# Patient Record
Sex: Male | Born: 1980 | Race: White | Hispanic: No | Marital: Married | Smoking: Never smoker
Health system: Southern US, Community
[De-identification: ages and names within clinical notes are randomized; demographics above are authoritative.]

## PROBLEM LIST (undated history)

## (undated) DIAGNOSIS — G4700 Insomnia, unspecified: Secondary | ICD-10-CM

## (undated) DIAGNOSIS — G4730 Sleep apnea, unspecified: Secondary | ICD-10-CM

## (undated) DIAGNOSIS — Z8249 Family history of ischemic heart disease and other diseases of the circulatory system: Secondary | ICD-10-CM

## (undated) DIAGNOSIS — I10 Essential (primary) hypertension: Secondary | ICD-10-CM

## (undated) HISTORY — DX: Sleep apnea, unspecified: G47.30

## (undated) HISTORY — DX: Family history of ischemic heart disease and other diseases of the circulatory system: Z82.49

## (undated) HISTORY — DX: Essential (primary) hypertension: I10

## (undated) HISTORY — DX: Insomnia, unspecified: G47.00

---

## 2015-03-25 DIAGNOSIS — G4733 Obstructive sleep apnea (adult) (pediatric): Secondary | ICD-10-CM | POA: Insufficient documentation

## 2015-03-25 DIAGNOSIS — I10 Essential (primary) hypertension: Secondary | ICD-10-CM | POA: Insufficient documentation

## 2015-03-27 DIAGNOSIS — M519 Unspecified thoracic, thoracolumbar and lumbosacral intervertebral disc disorder: Secondary | ICD-10-CM | POA: Insufficient documentation

## 2015-03-27 DIAGNOSIS — M25571 Pain in right ankle and joints of right foot: Secondary | ICD-10-CM | POA: Insufficient documentation

## 2015-08-27 ENCOUNTER — Ambulatory Visit (INDEPENDENT_AMBULATORY_CARE_PROVIDER_SITE_OTHER): Payer: 59 | Admitting: Osteopathic Medicine

## 2015-08-27 ENCOUNTER — Other Ambulatory Visit: Payer: Self-pay | Admitting: Osteopathic Medicine

## 2015-08-27 ENCOUNTER — Encounter: Payer: Self-pay | Admitting: Osteopathic Medicine

## 2015-08-27 VITALS — BP 125/85 | HR 78 | Ht 74.0 in | Wt 304.0 lb

## 2015-08-27 DIAGNOSIS — G4700 Insomnia, unspecified: Secondary | ICD-10-CM

## 2015-08-27 DIAGNOSIS — Z8249 Family history of ischemic heart disease and other diseases of the circulatory system: Secondary | ICD-10-CM | POA: Diagnosis not present

## 2015-08-27 DIAGNOSIS — R202 Paresthesia of skin: Secondary | ICD-10-CM

## 2015-08-27 HISTORY — DX: Family history of ischemic heart disease and other diseases of the circulatory system: Z82.49

## 2015-08-27 HISTORY — DX: Insomnia, unspecified: G47.00

## 2015-08-27 LAB — GLUCOSE, POCT (MANUAL RESULT ENTRY): POC Glucose: 92 mg/dl (ref 70–99)

## 2015-08-27 MED ORDER — GABAPENTIN 100 MG PO CAPS: 100.0000 mg | ORAL_CAPSULE | Freq: Three times a day (TID) | ORAL | Status: DC | PRN

## 2015-08-27 MED ORDER — TRAZODONE HCL 100 MG PO TABS: 50.0000 mg | ORAL_TABLET | Freq: Every evening | ORAL | Status: DC | PRN

## 2015-08-27 NOTE — Progress Notes (Signed)
/PI: Wesley Pacheco is a 35 y.o. Not Hispanic or Latino male   who presents to [facility] today, 08/27/2015,  for chief complaint of:  Chief Complaint  Patient presents with  . Establish Care    BLOOD PRESSURE, FOOT PAIN B/L    Foot pain . Context: hx back injury years ago. Had MRI 10/2013 in [place] - (+) pinched nerve, bulging discs, crushed vertebrae, DJD advanced. 05/2014 injury and whole left leg was numb. Patient is concerned that numbness may have something to do with diabetes. He has never had routine blood work that he can recall. . Location: both feet  . Quality: "feels like nerve pains" occasional numbness, one leg or another . Timing: "out of the blue," seems worse "if I eat really bad that day." particularly when eats red meat, no correlation with sugary or salty foods.  . Assoc signs/symptoms: occasional swelling in feet, whichever is painful.    MEDICAL HISTORY BRIEFLY REVIEWED   SLEEP APNEA - CPAP machine, uses about 50% of the time, has been a few years since updated   HIGH BLOOD PRESSURE - Usually 160's / 90's. Per the patient. On my record review, seems to be 0000000 systolic. Father deceased in 23's due to MI - HTN, no EtOH or Cocaine. Had aortic dissection.   INSOMNIA - Melatonin helps.     Patient is accompanied by wife, Museum/gallery conservator, who assists with history-taking.     Past medical, surgical, social and family history reviewed: No past medical history on file. No past surgical history on file. Social History  Substance Use Topics  . Smoking status: Not on file  . Smokeless tobacco: Not on file  . Alcohol Use: Not on file   No family history on file.   Current medication list and allergy/intolerance information reviewed:   Current Outpatient Prescriptions  Medication Sig Dispense Refill  . fluticasone (FLONASE) 50 MCG/ACT nasal spray      No current facility-administered medications for this visit.   No Known Allergies     Review of Systems:  Constitutional:  No  fever, no chills, No recent illness, No unintentional weight changes. No significant fatigue.   HEENT: (+) occasional headache, no vision change, no hearing change, No sore throat, No  sinus pressure  Cardiac: No  chest pain, No  pressure, No palpitations, No  Orthopnea   Respiratory:  No  shortness of breath. No  Cough  Gastrointestinal: No  abdominal pain, No  nausea, No  vomiting,  No  blood in stool, No  diarrhea, No  constipation   Musculoskeletal: No new myalgia/arthralgia, (+) chronic back pain   Genitourinary: No  incontinence, No  abnormal genital bleeding, No abnormal genital discharge  Skin: No  Rash, No other wounds/concerning lesions  Hem/Onc: No  easy bruising/bleeding, No  abnormal lymph node  Endocrine: No cold intolerance,  No heat intolerance. No polyuria/polydipsia/polyphagia   Neurologic: No  weakness, No  dizziness, No  slurred speech/focal weakness/facial droop, (+) burning pain in feet  Psychiatric: No  concerns with depression, No  concerns with anxiety, (+) sleep problems, No mood problems  Exam:  BP 159/78 mmHg  Pulse 78  Ht 6\' 2"  (1.88 m)  Wt 304 lb (137.893 kg)  BMI 39.01 kg/m2  Constitutional: VS see above. General Appearance: alert, well-developed, well-nourished, NAD  Eyes: Normal lids and conjunctive, non-icteric sclera  Ears, Nose, Mouth, Throat: MMM, Normal external inspection ears/nares/mouth/lips/gums\ Pharynx/tonsils no erythema, no exudate. Nasal mucosa normal.  Neck: No masses, trachea midline. No thyroid enlargement. No tenderness/mass appreciated. No lymphadenopathy  Respiratory: Normal respiratory effort. no wheeze, no rhonchi, no rales  Cardiovascular: S1/S2 normal, no murmur, no rub/gallop auscultated. RRR. No lower extremity edema. Pedal pulse II/IV bilaterally DP and PT. No abdominal aortic bruit. Capillary refill slightly over 3 seconds in feet, normal elsewhere. Homans sign  negative bilaterally  Gastrointestinal: Nontender, no masses. No hepatomegaly, no splenomegaly. No hernia appreciated. Bowel sounds normal. Rectal exam deferred.   Musculoskeletal: Gait normal. No clubbing/cyanosis of digits. MR intermission ankles bilaterally, drawer test negative. Straight leg raise negative bilaterally.  Neurological: No cranial nerve deficit on limited exam. Normal balance/coordination. No tremor. No significant diminished sensation on monofilament exam, some decreased sensation calluses over heels.  Skin: warm, dry, intact. No rash/ulcer. No concerning nevi or subq nodules on limited exam.    Psychiatric: Normal judgment/insight. Normal mood and affect. Oriented x3.    Results for orders placed or performed in visit on 08/27/15 (from the past 72 hour(s))  POCT glucose (manual entry)     Status: None   Collection Time: 08/27/15 10:15 AM  Result Value Ref Range   POC Glucose 92 70 - 99 mg/dl     ASSESSMENT/PLAN:   Neuropathic foot pain/paresthesia more likely due to history of back issues, we'll see if we can request the previous MRI results, sounds like he definitely had some issues as far as degenerative disc disease, possible foraminal stenosis, consider repeat MRI. Trial gabapentin when burning pain is a problem. Unlikely diabetes based on nonfasting blood glucose in the office today  Advised to follow-up to recheck CPAP settings, this may be contributing to insomnia. Trial trazodone as needed.  Significant cardiac risk based on family history, lipid screening as below, increase aerobic exercise, advised weight loss, dietary modifications to lose weight and overall decrease cardiovascular risk  Paresthesia of bilateral legs - Plan: POCT glucose (manual entry), CBC with Differential/Platelet, COMPLETE METABOLIC PANEL WITH GFR, Lipid panel, TSH, VITAMIN D 25 Hydroxy (Vit-D Deficiency, Fractures), B12, DISCONTINUED: gabapentin (NEURONTIN) 100 MG capsule  Family  history of cardiac disorder in father - Plan: COMPLETE METABOLIC PANEL WITH GFR, Lipid panel, TSH  Insomnia - Plan: DISCONTINUED: traZODone (DESYREL) 100 MG tablet     Visit summary with medication list and pertinent instructions was printed for patient to review. All questions at time of visit were answered - patient instructed to contact office with any additional concerns. ER/RTC precautions were reviewed with the patient. Follow-up plan: Return if symptoms worsen or fail to improve - can discuss alternative medicines/doses or further workup. We'll request records regarding old MRI

## 2015-08-27 NOTE — Patient Instructions (Signed)
I'd recommend follow up with your home CPAP company to inquire about re-testing for CPAP adjustment and sleep apnea severity as this can affect blood pressure and insomnia issues.   Let's plan to follow up in 3 months with nurse visit for blood pressure check, sooner if needed and/or depending on lab results.

## 2016-11-26 ENCOUNTER — Other Ambulatory Visit: Payer: Self-pay | Admitting: Osteopathic Medicine

## 2016-11-26 ENCOUNTER — Encounter: Payer: Self-pay | Admitting: Osteopathic Medicine

## 2016-11-26 ENCOUNTER — Ambulatory Visit (INDEPENDENT_AMBULATORY_CARE_PROVIDER_SITE_OTHER): Payer: 59 | Admitting: Osteopathic Medicine

## 2016-11-26 VITALS — BP 150/90 | Ht 74.0 in | Wt 311.0 lb

## 2016-11-26 DIAGNOSIS — D229 Melanocytic nevi, unspecified: Secondary | ICD-10-CM

## 2016-11-26 DIAGNOSIS — I10 Essential (primary) hypertension: Secondary | ICD-10-CM

## 2016-11-26 DIAGNOSIS — G4730 Sleep apnea, unspecified: Secondary | ICD-10-CM

## 2016-11-26 DIAGNOSIS — G4733 Obstructive sleep apnea (adult) (pediatric): Secondary | ICD-10-CM

## 2016-11-26 HISTORY — DX: Sleep apnea, unspecified: G47.30

## 2016-11-26 MED ORDER — AMBULATORY NON FORMULARY MEDICATION: 99 refills | Status: DC

## 2016-11-26 MED ORDER — ENALAPRIL MALEATE 5 MG PO TABS: 5.0000 mg | ORAL_TABLET | Freq: Every day | ORAL | 1 refills | Status: DC

## 2016-11-26 NOTE — Patient Instructions (Addendum)
Start blood pressure medication/ Will get labs today. Will check BP again in the office in 2-4 weeks. Will recheck labs in 4-6 weeks.   You should hear bck about CPAP equipment   Even fairly severe allergies can typically be treated with over-the-counter medications, I usually will recommend a combination of steroid nasal spray such as Flonase or Nasonex or either one of their generics, in combination with an antihistamine such as Allegra, Zyrtec, or Claritin or one of the generics, plus or minus a decongestant, either Sudafed on its own or any of the antihistamines with the "-D" in the name that he has to show your ID to the pharmacist to buy.      DASH Eating Plan DASH stands for "Dietary Approaches to Stop Hypertension." The DASH eating plan is a healthy eating plan that has been shown to reduce high blood pressure (hypertension). It may also reduce your risk for type 2 diabetes, heart disease, and stroke. The DASH eating plan may also help with weight loss. What are tips for following this plan? General guidelines  Avoid eating more than 2,300 mg (milligrams) of salt (sodium) a day. If you have hypertension, you may need to reduce your sodium intake to 1,500 mg a day.  Limit alcohol intake to no more than 1 drink a day for nonpregnant women and 2 drinks a day for men. One drink equals 12 oz of beer, 5 oz of wine, or 1 oz of hard liquor.  Work with your health care provider to maintain a healthy body weight or to lose weight. Ask what an ideal weight is for you.  Get at least 30 minutes of exercise that causes your heart to beat faster (aerobic exercise) most days of the week. Activities may include walking, swimming, or biking.  Work with your health care provider or diet and nutrition specialist (dietitian) to adjust your eating plan to your individual calorie needs. Reading food labels  Check food labels for the amount of sodium per serving. Choose foods with less than 5 percent of  the Daily Value of sodium. Generally, foods with less than 300 mg of sodium per serving fit into this eating plan.  To find whole grains, look for the word "whole" as the first word in the ingredient list. Shopping  Buy products labeled as "low-sodium" or "no salt added."  Buy fresh foods. Avoid canned foods and premade or frozen meals. Cooking  Avoid adding salt when cooking. Use salt-free seasonings or herbs instead of table salt or sea salt. Check with your health care provider or pharmacist before using salt substitutes.  Do not fry foods. Cook foods using healthy methods such as baking, boiling, grilling, and broiling instead.  Cook with heart-healthy oils, such as olive, canola, soybean, or sunflower oil. Meal planning   Eat a balanced diet that includes: ? 5 or more servings of fruits and vegetables each day. At each meal, try to fill half of your plate with fruits and vegetables. ? Up to 6-8 servings of whole grains each day. ? Less than 6 oz of lean meat, poultry, or fish each day. A 3-oz serving of meat is about the same size as a deck of cards. One egg equals 1 oz. ? 2 servings of low-fat dairy each day. ? A serving of nuts, seeds, or beans 5 times each week. ? Heart-healthy fats. Healthy fats called Omega-3 fatty acids are found in foods such as flaxseeds and coldwater fish, like sardines, salmon, and mackerel.  how much you eat of the following: ? Canned or prepackaged foods. ? Food that is high in trans fat, such as fried foods. ? Food that is high in saturated fat, such as fatty meat. ? Sweets, desserts, sugary drinks, and other foods with added sugar. ? Full-fat dairy products.  Do not salt foods before eating.  Try to eat at least 2 vegetarian meals each week.  Eat more home-cooked food and less restaurant, buffet, and fast food.  When eating at a restaurant, ask that your food be prepared with less salt or no salt, if possible. What foods are  recommended? The items listed may not be a complete list. Talk with your dietitian about what dietary choices are best for you. Grains Whole-grain or whole-wheat bread. Whole-grain or whole-wheat pasta. Brown rice. Oatmeal. Quinoa. Bulgur. Whole-grain and low-sodium cereals. Pita bread. Low-fat, low-sodium crackers. Whole-wheat flour tortillas. Vegetables Fresh or frozen vegetables (raw, steamed, roasted, or grilled). Low-sodium or reduced-sodium tomato and vegetable juice. Low-sodium or reduced-sodium tomato sauce and tomato paste. Low-sodium or reduced-sodium canned vegetables. Fruits All fresh, dried, or frozen fruit. Canned fruit in natural juice (without added sugar). Meat and other protein foods Skinless chicken or turkey. Ground chicken or turkey. Pork with fat trimmed off. Fish and seafood. Egg whites. Dried beans, peas, or lentils. Unsalted nuts, nut butters, and seeds. Unsalted canned beans. Lean cuts of beef with fat trimmed off. Low-sodium, lean deli meat. Dairy Low-fat (1%) or fat-free (skim) milk. Fat-free, low-fat, or reduced-fat cheeses. Nonfat, low-sodium ricotta or cottage cheese. Low-fat or nonfat yogurt. Low-fat, low-sodium cheese. Fats and oils Soft margarine without trans fats. Vegetable oil. Low-fat, reduced-fat, or light mayonnaise and salad dressings (reduced-sodium). Canola, safflower, olive, soybean, and sunflower oils. Avocado. Seasoning and other foods Herbs. Spices. Seasoning mixes without salt. Unsalted popcorn and pretzels. Fat-free sweets. What foods are not recommended? The items listed may not be a complete list. Talk with your dietitian about what dietary choices are best for you. Grains Baked goods made with fat, such as croissants, muffins, or some breads. Dry pasta or rice meal packs. Vegetables Creamed or fried vegetables. Vegetables in a cheese sauce. Regular canned vegetables (not low-sodium or reduced-sodium). Regular canned tomato sauce and paste (not  low-sodium or reduced-sodium). Regular tomato and vegetable juice (not low-sodium or reduced-sodium). Pickles. Olives. Fruits Canned fruit in a light or heavy syrup. Fried fruit. Fruit in cream or butter sauce. Meat and other protein foods Fatty cuts of meat. Ribs. Fried meat. Bacon. Sausage. Bologna and other processed lunch meats. Salami. Fatback. Hotdogs. Bratwurst. Salted nuts and seeds. Canned beans with added salt. Canned or smoked fish. Whole eggs or egg yolks. Chicken or turkey with skin. Dairy Whole or 2% milk, cream, and half-and-half. Whole or full-fat cream cheese. Whole-fat or sweetened yogurt. Full-fat cheese. Nondairy creamers. Whipped toppings. Processed cheese and cheese spreads. Fats and oils Butter. Stick margarine. Lard. Shortening. Ghee. Bacon fat. Tropical oils, such as coconut, palm kernel, or palm oil. Seasoning and other foods Salted popcorn and pretzels. Onion salt, garlic salt, seasoned salt, table salt, and sea salt. Worcestershire sauce. Tartar sauce. Barbecue sauce. Teriyaki sauce. Soy sauce, including reduced-sodium. Steak sauce. Canned and packaged gravies. Fish sauce. Oyster sauce. Cocktail sauce. Horseradish that you find on the shelf. Ketchup. Mustard. Meat flavorings and tenderizers. Bouillon cubes. Hot sauce and Tabasco sauce. Premade or packaged marinades. Premade or packaged taco seasonings. Relishes. Regular salad dressings. Where to find more information:  National Heart, Lung, and Blood Institute:   Institute: https://wilson-eaton.com/  American Heart Association: www.heart.org Summary  The DASH eating plan is a healthy eating plan that has been shown to reduce high blood pressure (hypertension). It may also reduce your risk for type 2 diabetes, heart disease, and stroke.  With the DASH eating plan, you should limit salt (sodium) intake to 2,300 mg a day. If you have hypertension, you may need to reduce your sodium intake to 1,500 mg a day.  When on the DASH eating plan,  aim to eat more fresh fruits and vegetables, whole grains, lean proteins, low-fat dairy, and heart-healthy fats.  Work with your health care provider or diet and nutrition specialist (dietitian) to adjust your eating plan to your individual calorie needs. This information is not intended to replace advice given to you by your health care provider. Make sure you discuss any questions you have with your health care provider. Document Released: 01/22/2011 Document Revised: 01/27/2016 Document Reviewed: 01/27/2016 Elsevier Interactive Patient Education  2017 [place].     Heart-Healthy Eating Plan Many factors influence your heart health, including eating and exercise habits. Heart (coronary) risk increases with abnormal blood fat (lipid) levels. Heart-healthy meal planning includes limiting unhealthy fats, increasing healthy fats, and making other small dietary changes. This includes maintaining a healthy body weight to help keep lipid levels within a normal range.  What types of fat should I choose?  Choose healthy fats more often. Choose monounsaturated and polyunsaturated fats, such as olive oil and canola oil, flaxseeds, walnuts, almonds, and seeds.  Eat more omega-3 fats. Good choices include salmon, mackerel, sardines, tuna, flaxseed oil, and ground flaxseeds. Aim to eat fish at least two times each week.  Limit saturated fats. Saturated fats are primarily found in animal products, such as meats, butter, and cream. Plant sources of saturated fats include palm oil, palm kernel oil, and coconut oil.  Avoid foods with partially hydrogenated oils in them. These contain trans fats. Examples of foods that contain trans fats are stick margarine, some tub margarines, cookies, crackers, and other baked goods. What general guidelines do I need to follow?  Check food labels carefully to identify foods with trans fats or high amounts of saturated fat.  Fill one half of your plate with  vegetables and green salads. Eat 4-5 servings of vegetables per day. A serving of vegetables equals 1 cup of raw leafy vegetables,  cup of raw or cooked cut-up vegetables, or  cup of vegetable juice.  Fill one fourth of your plate with whole grains. Look for the word "whole" as the first word in the ingredient list.  Fill one fourth of your plate with lean protein foods.  Eat 4-5 servings of fruit per day. A serving of fruit equals one medium whole fruit,  cup of dried fruit,  cup of fresh, frozen, or canned fruit, or  cup of 100% fruit juice.  Eat more foods that contain soluble fiber. Examples of foods that contain this type of fiber are apples, broccoli, carrots, beans, peas, and barley. Aim to get 20-30 g of fiber per day.  Eat more home-cooked food and less restaurant, buffet, and fast food.  Limit or avoid alcohol.  Limit foods that are high in starch and sugar.  Avoid fried foods.  Cook foods by using methods other than frying. Baking, boiling, grilling, and broiling are all great options. Other fat-reducing suggestions include: ? Removing the skin from poultry. ? Removing all visible fats from meats. ? Skimming the fat off of stews,  soups, and gravies before serving them. ? Steaming vegetables in water or broth.  Lose weight if you are overweight. Losing just 5-10% of your initial body weight can help your overall health and prevent diseases such as diabetes and heart disease.  Increase your consumption of nuts, legumes, and seeds to 4-5 servings per week. One serving of dried beans or legumes equals  cup after being cooked, one serving of nuts equals 1 ounces, and one serving of seeds equals  ounce or 1 tablespoon.  You may need to monitor your salt (sodium) intake, especially if you have high blood pressure. Talk with your health care provider or dietitian to get more information about reducing sodium. What foods can I eat? Grains  Breads, including [place], white,  pita, wheat, raisin, rye, oatmeal, and [place]. Tortillas that are neither fried nor made with lard or trans fat. Low-fat rolls, including hotdog and hamburger buns and English muffins. Biscuits. Muffins. Waffles. Pancakes. Light popcorn. Whole-grain cereals. Flatbread. Melba toast. Pretzels. Breadsticks. Rusks. Low-fat snacks and crackers, including oyster, saltine, matzo, graham, animal, and rye. Rice and pasta, including brown rice and those that are made with whole wheat. Vegetables All vegetables. Fruits All fruits, but limit coconut. Meats and Other Protein Sources Lean, well-trimmed beef, veal, pork, and lamb. Chicken and [place] without skin. All fish and shellfish. Wild duck, rabbit, pheasant, and venison. Egg whites or low-cholesterol egg substitutes. Dried beans, peas, lentils, and tofu.Seeds and most nuts. Dairy Low-fat or nonfat cheeses, including ricotta, string, and mozzarella. Skim or 1% milk that is liquid, powdered, or evaporated. Buttermilk that is made with low-fat milk. Nonfat or low-fat yogurt. Beverages Mineral water. Diet carbonated beverages. Sweets and Desserts Sherbets and fruit ices. Honey, jam, marmalade, jelly, and syrups. Meringues and gelatins. Pure sugar candy, such as hard candy, jelly beans, gumdrops, mints, marshmallows, and small amounts of dark chocolate. [employer] Eat all sweets and desserts in moderation. Fats and Oils Nonhydrogenated (trans-free) margarines. Vegetable oils, including soybean, sesame, sunflower, olive, peanut, safflower, corn, canola, and cottonseed. Salad dressings or mayonnaise that are made with a vegetable oil. Limit added fats and oils that you use for cooking, baking, salads, and as spreads. Other Cocoa powder. Coffee and tea. All seasonings and condiments. The items listed above may not be a complete list of recommended foods or beverages. Contact your dietitian for more options. What foods are not recommended? Grains Breads  that are made with saturated or trans fats, oils, or whole milk. Croissants. Butter rolls. Cheese breads. Sweet rolls. Donuts. Buttered popcorn. Chow mein noodles. High-fat crackers, such as cheese or butter crackers. Meats and Other Protein Sources Fatty meats, such as hotdogs, short ribs, sausage, spareribs, bacon, ribeye roast or steak, and mutton. High-fat deli meats, such as salami and bologna. Caviar. Domestic duck and goose. Organ meats, such as kidney, liver, sweetbreads, brains, gizzard, chitterlings, and heart. Dairy Cream, sour cream, cream cheese, and creamed cottage cheese. Whole milk cheeses, including blue (bleu), Monte[place] Jack, [place], [place], [place], [place], [place], [place], [place], and [place]. Whole or 2% milk that is liquid, evaporated, or condensed. Whole buttermilk. Cream sauce or high-fat cheese sauce. Yogurt that is made from whole milk. Beverages Regular sodas and drinks with added sugar. Sweets and Desserts Frosting. Pudding. Cookies. Cakes other than angel food cake. Candy that has milk chocolate or white chocolate, hydrogenated fat, butter, coconut, or unknown ingredients. Buttered syrups. Full-fat ice cream or ice cream drinks. Fats and Oils Gravy that has suet, meat fat,  or shortening. Cocoa butter, hydrogenated oils, palm oil, coconut oil, palm kernel oil. These can often be found in baked products, candy, fried foods, nondairy creamers, and whipped toppings. Solid fats and shortenings, including bacon fat, salt pork, lard, and butter. Nondairy cream substitutes, such as coffee creamers and sour cream substitutes. Salad dressings that are made of unknown oils, cheese, or sour cream. The items listed above may not be a complete list of foods and beverages to avoid. Contact your dietitian for more information. This information is not intended to replace advice given to you by your health care provider. Make sure you discuss any questions you have with your health care  provider. Document Released: 11/12/2007 Document Revised: 08/23/2015 Document Reviewed: 07/27/2013 Elsevier Interactive Patient Education  2017 [employer].

## 2016-11-26 NOTE — Progress Notes (Signed)
HPI: Wesley Pacheco is a 36 y.o. male  who presents to [facility] today, 11/26/16,  for chief complaint of:  Chief Complaint  Patient presents with  . Rash    ON HEAD  . Other    BLOOD PRESSURE CONCERNS    Blood pressure: Positive family history of heart problems and blood pressure issues in dad. Patient concerned about symptoms of headache with elevated blood pressure. No chest pain, dizziness, vision changes. Other risk factors include history of sleep apnea, has been years since sleep study or new equipment. He does not complain of any worsening daytime somnolence or snoring. He does have significant facial hair  Would like spot of skin on head checked. Has been there for some time, not painful/itchy, not bothersome. Doesn't seem to be growing.   Past medical, surgical, social and family history reviewed: Patient Active Problem List   Diagnosis Date Noted  . Paresthesia of bilateral legs 08/27/2015  . Family history of cardiac disorder in father 08/27/2015  . Insomnia 08/27/2015   No past surgical history on file. Social History  Substance Use Topics  . Smoking status: Never Smoker  . Smokeless tobacco: Never Used  . Alcohol use Not on file   Family History  Problem Relation Age of Onset  . Cancer Mother   . Hypertension Father   . Heart attack Father      Current medication list and allergy/intolerance information reviewed:   Current Outpatient Prescriptions  Medication Sig Dispense Refill  . ibuprofen (ADVIL,MOTRIN) 600 MG tablet Take 600 mg by mouth every 6 (six) hours as needed.    . Melatonin 1 MG CAPS Take by mouth at bedtime.      No current facility-administered medications for this visit.    No Known Allergies    Review of Systems:  Constitutional:  No  fever, no chills, No recent illness  HEENT: No  headache, no vision change  Cardiac: No  chest pain, No  pressure, No palpitations, No  Orthopnea  Respiratory:  No   shortness of breath. No  Cough  Gastrointestinal: No  abdominal pain, No  nausea  Musculoskeletal: No new myalgia/arthralgia  Skin: No  Rash, +other wounds/concerning lesions  Neurologic: No  weakness, No  dizziness   Exam:  BP (!) 150/90   Ht 6\' 2"  (1.88 m)   Wt (!) 311 lb (141.1 kg)   BMI 39.93 kg/m   Constitutional: VS see above. General Appearance: alert, well-developed, well-nourished, NAD  Eyes: Normal lids and conjunctive, non-icteric sclera  Ears, Nose, Mouth, Throat: MMM, Normal external inspection ears/nares/mouth/lips/gums.  Neck: No masses, trachea midline. No thyroid enlargement. No tenderness/mass appreciated. No lymphadenopathy  Respiratory: Normal respiratory effort. no wheeze, no rhonchi, no rales  Cardiovascular: S1/S2 normal, no murmur, no rub/gallop auscultated. RRR. No lower extremity edema.   Musculoskeletal: Gait normal. No clubbing/cyanosis of digits.   Neurological: Normal balance/coordination. No tremor  Skin: warm, dry, intact. No rash/ulcer. No concerning nevi or subq nodules on limited exam - benign slighly hyperpigmented elsions on forehead/scalp   Psychiatric: Normal judgment/insight. Normal mood and affect. Oriented x3.     ASSESSMENT/PLAN:   Essential hypertension - Plan: CBC, COMPLETE METABOLIC PANEL WITH GFR, Lipid panel, TSH  Obstructive sleep apnea syndrome - Plan: TSH, Ambulatory referral to Sleep Studies  Benign nevus of skin - Keep an eye on this, if changes/worsens would consider biopsy/dermatology referral    Patient Instructions  Start blood pressure medication/ Will get labs today.  Will check BP again in the office in 2-4 weeks. Will recheck labs in 4-6 weeks.   You should hear back about CPAP equipment   Even fairly severe allergies can typically be treated with over-the-counter medications, I usually will recommend a combination of steroid nasal spray such as Flonase or Nasonex or either one of their generics, in  combination with an antihistamine such as Allegra, Zyrtec, or Claritin or one of the generics, plus or minus a decongestant, either Sudafed on its own or any of the antihistamines with the "-D" in the name that he has to show your ID to the pharmacist to buy.       Visit summary with medication list and pertinent instructions was printed for patient to review. All questions at time of visit were answered - patient instructed to contact office with any additional concerns. ER/RTC precautions were reviewed with the patient. Follow-up plan: Return in about 2 weeks (around 12/10/2016) for recheck BP on new meds.  Note: Total time spent 25 minutes, greater than 50% of the visit was spent face-to-face counseling and coordinating care for the following: The primary encounter diagnosis was Essential hypertension. Diagnoses of Obstructive sleep apnea syndrome and Benign nevus of skin were also pertinent to this visit.Marland Kitchen

## 2016-11-27 ENCOUNTER — Encounter: Payer: Self-pay | Admitting: Osteopathic Medicine

## 2016-11-27 DIAGNOSIS — D229 Melanocytic nevi, unspecified: Secondary | ICD-10-CM | POA: Insufficient documentation

## 2016-11-27 DIAGNOSIS — I10 Essential (primary) hypertension: Secondary | ICD-10-CM

## 2016-11-27 HISTORY — DX: Essential (primary) hypertension: I10

## 2016-11-28 LAB — COMPLETE METABOLIC PANEL WITH GFR
AG Ratio: 1.6 (calc) (ref 1.0–2.5)
ALKALINE PHOSPHATASE (APISO): 41 U/L (ref 40–115)
ALT: 51 U/L — ABNORMAL HIGH (ref 9–46)
AST: 29 U/L (ref 10–40)
Albumin: 4.4 g/dL (ref 3.6–5.1)
BILIRUBIN TOTAL: 0.8 mg/dL (ref 0.2–1.2)
BUN: 15 mg/dL (ref 7–25)
CHLORIDE: 103 mmol/L (ref 98–110)
CO2: 27 mmol/L (ref 20–32)
CREATININE: 0.95 mg/dL (ref 0.60–1.35)
Calcium: 9.5 mg/dL (ref 8.6–10.3)
GFR, Est African American: 119 mL/min/{1.73_m2} (ref >=60)
GFR, Est Non African American: 103 mL/min/{1.73_m2} (ref >=60)
Globulin: 2.7 g/dL (calc) (ref 1.9–3.7)
Glucose, Bld: 95 mg/dL (ref 65–99)
Potassium: 4.3 mmol/L (ref 3.5–5.3)
Sodium: 138 mmol/L (ref 135–146)
Total Protein: 7.1 g/dL (ref 6.1–8.1)

## 2016-11-28 LAB — LIPID PANEL
CHOL/HDL RATIO: 6.9 (calc) — AB (ref <5.0)
CHOLESTEROL: 200 mg/dL — AB (ref <200)
HDL: 29 mg/dL — AB (ref >40)
LDL CHOLESTEROL (CALC): 132 mg/dL — AB
Non-HDL Cholesterol (Calc): 171 mg/dL (calc) — ABNORMAL HIGH (ref <130)
Triglycerides: 240 mg/dL — ABNORMAL HIGH (ref <150)

## 2016-11-28 LAB — CBC
HEMATOCRIT: 48.7 % (ref 38.5–50.0)
HEMOGLOBIN: 16.7 g/dL (ref 13.2–17.1)
MCH: 30.3 pg (ref 27.0–33.0)
MCHC: 34.3 g/dL (ref 32.0–36.0)
MCV: 88.2 fL (ref 80.0–100.0)
MPV: 9.1 fL (ref 7.5–12.5)
PLATELETS: 269 10*3/uL (ref 140–400)
RBC: 5.52 10*6/uL (ref 4.20–5.80)
RDW: 12.9 % (ref 11.0–15.0)
WBC: 7.7 10*3/uL (ref 3.8–10.8)

## 2016-11-28 LAB — TSH: TSH: 2.53 mIU/L (ref 0.40–4.50)

## 2016-12-10 ENCOUNTER — Encounter: Payer: Self-pay | Admitting: Osteopathic Medicine

## 2016-12-10 ENCOUNTER — Ambulatory Visit (INDEPENDENT_AMBULATORY_CARE_PROVIDER_SITE_OTHER): Payer: 59 | Admitting: Osteopathic Medicine

## 2016-12-10 VITALS — BP 144/84 | HR 68 | Ht 74.0 in | Wt 313.0 lb

## 2016-12-10 DIAGNOSIS — R7401 Elevation of levels of liver transaminase levels: Secondary | ICD-10-CM | POA: Insufficient documentation

## 2016-12-10 DIAGNOSIS — G4733 Obstructive sleep apnea (adult) (pediatric): Secondary | ICD-10-CM

## 2016-12-10 DIAGNOSIS — I10 Essential (primary) hypertension: Secondary | ICD-10-CM | POA: Diagnosis not present

## 2016-12-10 DIAGNOSIS — R740 Nonspecific elevation of levels of transaminase and lactic acid dehydrogenase [LDH]: Secondary | ICD-10-CM | POA: Diagnosis not present

## 2016-12-10 DIAGNOSIS — E785 Hyperlipidemia, unspecified: Secondary | ICD-10-CM | POA: Diagnosis not present

## 2016-12-10 MED ORDER — ENALAPRIL MALEATE 10 MG PO TABS: 10.0000 mg | ORAL_TABLET | Freq: Every day | ORAL | 1 refills | Status: DC

## 2016-12-10 NOTE — Progress Notes (Signed)
HPI: Wesley Pacheco is a 36 y.o. male  who presents to [facility] today, 12/10/16,  for chief complaint of:  Chief Complaint  Patient presents with  . Follow-up    blood pressure    Blood pressure: Positive family history of heart problems and blood pressure issues in dad. Patient concerned about symptoms of headache with elevated blood pressure. No chest pain, dizziness, vision changes. Other risk factors include history of sleep apnea, has been years since sleep study or new equipment. He does not complain of any worsening daytime somnolence or snoring. He does have significant facial hair. We ordered new sleep study/equipment last visit 11/26/16, as well as start enalapril 5 mg. No problems on new medicine. Sleep study done, results pending.      Past medical, surgical, social and family history reviewed: Patient Active Problem List   Diagnosis Date Noted  . Benign nevus of skin 11/27/2016  . Essential hypertension 11/27/2016  . Sleep apnea 11/26/2016  . Paresthesia of bilateral legs 08/27/2015  . Family history of cardiac disorder in father 08/27/2015  . Insomnia 08/27/2015   No past surgical history on file. Social History  Substance Use Topics  . Smoking status: Never Smoker  . Smokeless tobacco: Never Used  . Alcohol use Not on file   Family History  Problem Relation Age of Onset  . Cancer Mother   . Hypertension Father   . Heart attack Father      Current medication list and allergy/intolerance information reviewed:   Current Outpatient Prescriptions  Medication Sig Dispense Refill  . AMBULATORY NON FORMULARY MEDICATION Supply ordered: CPAP and other supplies needed (headgear, cushions, filters, heated tuubing and water chamber) Dx: obstructive sleep apnea Settings: auto-titration 5-25 mmHg 1 Units prn  . enalapril (VASOTEC) 5 MG tablet TAKE 1 TABLET(5 MG) BY MOUTH DAILY 90 tablet 1  . ibuprofen (ADVIL,MOTRIN) 600 MG tablet Take  600 mg by mouth every 6 (six) hours as needed.    . Melatonin 1 MG CAPS Take by mouth at bedtime.      No current facility-administered medications for this visit.    No Known Allergies    Review of Systems:  Constitutional:  No  fever, no chills, No recent illness  Cardiac: No  chest pain, No  pressure  Respiratory:  No  shortness of breath.     Exam:  BP (!) 144/84   Pulse 68   Ht 6\' 2"  (1.88 m)   Wt (!) 313 lb (142 kg)   BMI 40.19 kg/m   Constitutional: VS see above. General Appearance: alert, well-developed, well-nourished, NAD  Respiratory: Normal respiratory effort. no wheeze, no rhonchi, no rales  Cardiovascular: S1/S2 normal, no murmur, no rub/gallop auscultated. RRR. No lower extremity edema.   Musculoskeletal: Gait normal. No clubbing/cyanosis of digits.   Neurological: Normal balance/coordination. No tremor  Psychiatric: Normal judgment/insight. Normal mood and affect. Oriented x3.   Recent Results (from the past 2160 hour(s))  TSH     Status: None   Collection Time: 11/27/16  8:14 AM  Result Value Ref Range   TSH 2.53 0.40 - 4.50 mIU/L  CBC     Status: None   Collection Time: 11/27/16  8:17 AM  Result Value Ref Range   WBC 7.7 3.8 - 10.8 Thousand/uL   RBC 5.52 4.20 - 5.80 Million/uL   Hemoglobin 16.7 13.2 - 17.1 g/dL   HCT 48.7 38.5 - 50.0 %   MCV 88.2 80.0 - 100.0 fL  MCH 30.3 27.0 - 33.0 pg   MCHC 34.3 32.0 - 36.0 g/dL   RDW 12.9 11.0 - 15.0 %   Platelets 269 140 - 400 Thousand/uL   MPV 9.1 7.5 - 12.5 fL  COMPLETE METABOLIC PANEL WITH GFR     Status: Abnormal   Collection Time: 11/27/16  8:17 AM  Result Value Ref Range   Glucose, Bld 95 65 - 99 mg/dL    Comment: .            Fasting reference interval .    BUN 15 7 - 25 mg/dL   Creat 0.95 0.60 - 1.35 mg/dL   GFR, Est Non African American 103 > OR = 60 mL/min/1.24m2   GFR, Est African American 119 > OR = 60 mL/min/1.83m2   BUN/Creatinine Ratio NOT APPLICABLE 6 - 22 (calc)   Sodium 138  135 - 146 mmol/L   Potassium 4.3 3.5 - 5.3 mmol/L   Chloride 103 98 - 110 mmol/L   CO2 27 20 - 32 mmol/L   Calcium 9.5 8.6 - 10.3 mg/dL   Total Protein 7.1 6.1 - 8.1 g/dL   Albumin 4.4 3.6 - 5.1 g/dL   Globulin 2.7 1.9 - 3.7 g/dL (calc)   AG Ratio 1.6 1.0 - 2.5 (calc)   Total Bilirubin 0.8 0.2 - 1.2 mg/dL   Alkaline phosphatase (APISO) 41 40 - 115 U/L   AST 29 10 - 40 U/L   ALT 51 (H) 9 - 46 U/L  Lipid panel     Status: Abnormal   Collection Time: 11/27/16  8:17 AM  Result Value Ref Range   Cholesterol 200 (H) <200 mg/dL   HDL 29 (L) >40 mg/dL   Triglycerides 240 (H) <150 mg/dL   LDL Cholesterol (Calc) 132 (H) mg/dL (calc)    Comment: Reference range: <100 . Desirable range <100 mg/dL for primary prevention;   <70 mg/dL for patients with CHD or diabetic patients  with > or = 2 CHD risk factors. Marland Kitchen LDL-C is now calculated using the Martin-Hopkins  calculation, which is a validated novel method providing  better accuracy than the Friedewald equation in the  estimation of LDL-C.  Cresenciano Genre et al. Annamaria Helling. 2423;536(14): 2061-2068  (http://education.[employer].com/faq/FAQ164)    Total CHOL/HDL Ratio 6.9 (H) <5.0 (calc)   Non-HDL Cholesterol (Calc) 171 (H) <130 mg/dL (calc)    Comment: For patients with diabetes plus 1 major ASCVD risk  factor, treating to a non-HDL-C goal of <100 mg/dL  (LDL-C of <70 mg/dL) is considered a therapeutic  option.      ASSESSMENT/PLAN: Labs discussed. BP not quite al goal. Will escalate enalapril and see what things are looking like at nurse visit.   Essential hypertension - Plan: enalapril (VASOTEC) 10 MG tablet, COMPLETE METABOLIC PANEL WITH GFR  Obstructive sleep apnea syndrome  Mild hyperlipidemia  Elevated ALT measurement     Visit summary with medication list and pertinent instructions was printed for patient to review. All questions at time of visit were answered - patient instructed to contact office with any additional  concerns. ER/RTC precautions were reviewed with the patient. Follow-up plan: Return for 2 weeks nurse visit BP check, see Dr A within 6 months to follow up .  Note: Total time spent 25 minutes, greater than 50% of the visit was spent face-to-face counseling and coordinating care for the following: The primary encounter diagnosis was Essential hypertension. Diagnoses of Obstructive sleep apnea syndrome, Mild hyperlipidemia, and Elevated ALT measurement were also pertinent  to this visit.Marland Kitchen

## 2016-12-17 ENCOUNTER — Telehealth: Payer: Self-pay | Admitting: Osteopathic Medicine

## 2016-12-17 MED ORDER — AMBULATORY NON FORMULARY MEDICATION: 99 refills | Status: DC

## 2016-12-17 NOTE — Telephone Encounter (Signed)
Please call patient: Home sleep study showed severe sleep apnea, has he heard about updating/replacing old CPAP equipment?  I re-sent order today for new equipment

## 2016-12-17 NOTE — Telephone Encounter (Signed)
Pt advised, order sent to [facility].

## 2016-12-23 ENCOUNTER — Ambulatory Visit (INDEPENDENT_AMBULATORY_CARE_PROVIDER_SITE_OTHER): Payer: 59 | Admitting: Osteopathic Medicine

## 2016-12-23 VITALS — BP 136/78 | HR 79

## 2016-12-23 DIAGNOSIS — I10 Essential (primary) hypertension: Secondary | ICD-10-CM

## 2016-12-23 NOTE — Progress Notes (Signed)
Pt came into clinic today for BP check. At last OV his BP was borderline and was advised to take 2 tabs (20mg ) of enalapril at night. Pt has been tolerating this well. No negative side effects. BP today in office was at goal. Pended new Rx for enalapril 20mg  so Pt can take just one tab at night. Routing to PCP for review.

## 2016-12-24 MED ORDER — ENALAPRIL MALEATE 20 MG PO TABS: 20.0000 mg | ORAL_TABLET | Freq: Every day | ORAL | 1 refills | Status: DC

## 2016-12-24 NOTE — Progress Notes (Signed)
BP 136/78   Pulse 79    Essential hypertension - Plan: enalapril (VASOTEC) 20 MG tablet

## 2017-06-14 ENCOUNTER — Ambulatory Visit: Payer: 59 | Admitting: Osteopathic Medicine

## 2017-07-07 ENCOUNTER — Ambulatory Visit: Payer: 59 | Admitting: Physician Assistant

## 2017-07-09 ENCOUNTER — Encounter: Payer: Self-pay | Admitting: Physician Assistant

## 2017-07-09 ENCOUNTER — Ambulatory Visit: Payer: 59 | Admitting: Physician Assistant

## 2017-07-09 VITALS — BP 125/85 | HR 71 | Wt 313.0 lb

## 2017-07-09 DIAGNOSIS — H8110 Benign paroxysmal vertigo, unspecified ear: Secondary | ICD-10-CM | POA: Diagnosis not present

## 2017-07-09 DIAGNOSIS — H7401 Tympanosclerosis, right ear: Secondary | ICD-10-CM

## 2017-07-09 DIAGNOSIS — G4700 Insomnia, unspecified: Secondary | ICD-10-CM | POA: Diagnosis not present

## 2017-07-09 MED ORDER — ESZOPICLONE 1 MG PO TABS: 1.0000 mg | ORAL_TABLET | Freq: Every evening | ORAL | 0 refills | Status: DC | PRN

## 2017-07-09 NOTE — Progress Notes (Signed)
HPI:                                                                Wesley Pacheco is a 37 y.o. male who presents to [place]: [place] today for dizziness  Patient presents with history of sudden onset, episodic dizziness 2 weeks ago. Symptoms have resolved completely. Dizziness is described as "spinning" and "feeling drunk." First episode was very brief for about 10 seconds and resolved on its own. He proceeded to eat lunch and shortly thereafter became extremely dizzy, nauseated and vomiting. Reports symptoms were worse w/head movements. That episode lasted 8-10 hours and resolved with rest and Dramamine.  history of vertigo: yes, 2-3 years ago hearing change: none prodromal sx: none Syncope: no pertinent negatives:  staggering or ataxic gait, vomiting, headache, double vision, visual loss, slurred speech, numbness of the face or body, weakness, clumsiness, or incoordination    Also complains of chronic sleep difficulties. Currently taking 10 mg of Melatonin nightly. Has been taking various doses of Melatonin for years and feels he is needing higher doses and they are becoming less effective. He also has OSA and cannot use his CPAP without taking Melatonin. Reports difficulty falling asleep and will have premature awakenings around 3 am. Usual bed time 10pm-11pm and waking at 6 am   Depression screen [facility] 2/9 11/26/2016  Decreased Interest 2  Down, Depressed, Hopeless 2  PHQ - 2 Score 4  Altered sleeping 3  Tired, decreased energy 1  Change in appetite 2  Feeling bad or failure about yourself  1  Trouble concentrating 2  Moving slowly or fidgety/restless 0  Suicidal thoughts 0  PHQ-9 Score 13    No flowsheet data found.    Past Medical History:  Diagnosis Date  . Essential hypertension 11/27/2016  . Family history of cardiac disorder in father 08/27/2015  . Insomnia 08/27/2015  . Sleep apnea 11/26/2016   On CPAP, last sleep study  approximately 5-7 years ago    History reviewed. No pertinent surgical history. Social History   Tobacco Use  . Smoking status: Never Smoker  . Smokeless tobacco: Never Used  Substance Use Topics  . Alcohol use: Not on file   family history includes Cancer in his mother; Heart attack in his father; Hypertension in his father.    ROS: negative except as noted in the HPI  Medications: Current Outpatient Medications  Medication Sig Dispense Refill  . AMBULATORY NON FORMULARY MEDICATION Supply ordered: CPAP and other supplies needed (headgear, cushions, filters, heated tuubing and water chamber) Dx: obstructive sleep apnea Settings: auto-titration 5-25 mmHg 1 Units prn  . enalapril (VASOTEC) 20 MG tablet Take 1 tablet (20 mg total) daily by mouth. 90 tablet 1  . eszopiclone (LUNESTA) 1 MG TABS tablet Take 1 tablet (1 mg total) by mouth at bedtime as needed for sleep. Take immediately before bedtime 30 tablet 0  . ibuprofen (ADVIL,MOTRIN) 600 MG tablet Take 600 mg by mouth every 6 (six) hours as needed.    . Melatonin 1 MG CAPS Take by mouth at bedtime.      No current facility-administered medications for this visit.    No Known Allergies     Objective:  BP 125/85   Pulse 71  Wt (!) 313 lb (142 kg)   SpO2 97%   BMI 40.19 kg/m  Gen: alert, not ill-appearing, no acute distress, obese male HEENT: head normocephalic, atraumatic; conjunctiva and cornea clear, scarring of right TM, left TM pearly gray and semi-transparent, normal external canals bilaterally, oropharynx clear, moist mucus membranes; neck supple, no meningeal signs Pulm: Normal work of breathing, normal phonation Neuro:  cranial nerves II-XII intact, no nystagmus, normal finger-to-nose, normal heel-to-shin, negative pronator drift, normal rapid alternating movements, DTR's are diminished bilaterally, normal tone, no tremor MSK: strength 5/5 and symmetric in bilateral upper and lower extremities, normal gait and  station, negative Romberg Mental Status: alert and oriented x 3, speech articulate, and thought processes clear and goal-directed     No results found for this or any previous visit (from the past 72 hour(s)). No results found.    Assessment and Plan: 37 y.o. male with   Benign paroxysmal positional vertigo, unspecified laterality  Tympanosclerosis of right ear  Insomnia, unspecified type - Plan: eszopiclone (LUNESTA) 1 MG TABS tablet  BPPV - reassuring neuro exam. No red flag symptoms or anything to suggest central cause. Symptoms resolved spontaneously 2 weeks ago.   Insomnia - will trial Lunesta 1 mg. Stop melatonin. - counseled patient that tachyphylaxis can occur with any medication and he should try to cycle when he uses sleep aid and try to sleep on his own - counseled on sleep hygiene - he was also counseled that sleep aids may worsen his sleep apnea - it looks like there may be some uncontrolled depression. Recommend follow-up with his PCP to address this  Patient education and anticipatory guidance given Patient agrees with treatment plan Follow-up in 1 month with PCP for insomnia or sooner as needed if symptoms worsen or fail to improve  I spent 40 minutes with this patient, greater than 50% was face-to-face time counseling regarding the above diagnoses  Darlyne Russian PA-C

## 2017-07-09 NOTE — Patient Instructions (Addendum)
Start Lunesta. Take at your bedtime and make sure you have at least 7 hours allocated for sleep   Sleep Hygiene . Limiting daytime naps to 30 minutes . Napping does not make up for inadequate nighttime sleep. However, a short nap of 20-30 minutes can help to improve mood, alertness and performance.  . Avoiding stimulants such as  caffeine and nicotine close to bedtime.  And when it comes to alcohol, moderation is key 4. While alcohol is well-known to help you fall asleep faster, too much close to bedtime can disrupt sleep in the second half of the night as the body begins to process the alcohol.    . Exercising to promote good quality sleep.  As little as 10 minutes of aerobic exercise, such as walking or cycling, can drastically improve nighttime sleep quality.  For the best night's sleep, most people should avoid strenuous workouts close to bedtime. However, the effect of intense nighttime exercise on sleep differs from person to person, so find out what works best for you.   . Steering clear of food that can be disruptive right before sleep.   Heavy or rich foods, fatty or fried meals, spicy dishes, citrus fruits, and carbonated drinks can trigger indigestion for some people. When this occurs close to bedtime, it can lead to painful heartburn that disrupts sleep. . Ensuring adequate exposure to natural light.  This is particularly important for individuals who may not venture outside frequently. Exposure to sunlight during the day, as well as darkness at night, helps to maintain a healthy sleep-wake cycle . Marland Kitchen Establishing a regular relaxing bedtime routine.  A regular nightly routine helps the body recognize that it is bedtime. This could include taking warm shower or bath, reading a book, or light stretches. When possible, try to avoid emotionally upsetting conversations and activities before attempting to sleep. . Making sure that the sleep environment is pleasant.  Mattress and pillows should be  comfortable. The bedroom should be cool - between 60 and 67 degrees - for optimal sleep. Bright light from lamps, cell phone and TV screens can make it difficult to fall asleep4, so turn those light off or adjust them when possible. Consider using blackout curtains, eye shades, ear plugs, "white noise" machines, humidifiers, fans and other devices that can make the bedroom more relaxing. . Meditation. YouTube [address]. There are many smartphone apps as well

## 2017-07-14 ENCOUNTER — Ambulatory Visit: Payer: 59 | Admitting: Osteopathic Medicine

## 2017-09-24 ENCOUNTER — Encounter: Payer: Self-pay | Admitting: Emergency Medicine

## 2017-09-24 ENCOUNTER — Emergency Department
Admission: EM | Admit: 2017-09-24 | Discharge: 2017-09-24 | Disposition: A | Discharge status: Home / Self Care | Payer: 59 | Source: Home / Self Care | Attending: Family Medicine | Admitting: Family Medicine

## 2017-09-24 ENCOUNTER — Other Ambulatory Visit: Payer: Self-pay

## 2017-09-24 DIAGNOSIS — R21 Rash and other nonspecific skin eruption: Secondary | ICD-10-CM | POA: Diagnosis not present

## 2017-09-24 MED ORDER — TRIAMCINOLONE ACETONIDE 0.1 % EX CREA: 1.0000 "application " | TOPICAL_CREAM | Freq: Two times a day (BID) | CUTANEOUS | 0 refills | Status: DC

## 2017-09-24 MED ORDER — METHYLPREDNISOLONE SODIUM SUCC 40 MG IJ SOLR
80.0000 mg | Freq: Once | INTRAMUSCULAR | Status: AC
  Administered 2017-09-24: 80 mg via INTRAMUSCULAR

## 2017-09-24 MED ORDER — PREDNISONE 20 MG PO TABS: ORAL_TABLET | ORAL | 0 refills | Status: DC

## 2017-09-24 NOTE — ED Triage Notes (Signed)
Pt c/o redness/rash to arms, back, abdomen and buttocks. States he noticed one week ago. He has had something similar in past but was never seen.

## 2017-09-24 NOTE — Discharge Instructions (Signed)
°  You were given a shot of solumedrol (a steroid) today to help with itching and swelling from a likely allergic reaction.  You have been prescribed 5 days of prednisone, an oral steroid.  You may start this medication tomorrow with breakfast.    Please follow up with family medicine in 1 week if not improving, sooner if worsening.

## 2017-09-24 NOTE — ED Provider Notes (Signed)
[facility]    CSN: [id number] Arrival date & time: 09/24/17  0813     History   Chief Complaint Chief Complaint  Patient presents with  . Rash    HPI Wesley Pacheco is a 37 y.o. male.   HPI Wesley Pacheco is a 37 y.o. male presenting to UC with c/o red itchy rash to his arms, back, abdomen, buttock and minimally on legs. He had a similar rash as a small patch on his Left arm about 1 week ago, which resolved after applying cortisone cream.  This rash looks and feels the same as the smaller rash but it has spread over his body. He does work outside a lot and wonders if it is due to that. He tried benadryl the last few days but no relief. No new soaps, lotions or medications. Denies fever, chills, body aches.   Past Medical History:  Diagnosis Date  . Essential hypertension 11/27/2016  . Family history of cardiac disorder in father 08/27/2015  . Insomnia 08/27/2015  . Sleep apnea 11/26/2016   On CPAP, last sleep study approximately 5-7 years ago     Patient Active Problem List   Diagnosis Date Noted  . 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 Benign paroxysmal positional vertigo 07/09/2017  . Mild hyperlipidemia 12/10/2016  . Elevated ALT measurement 12/10/2016  . Benign nevus of skin 11/27/2016  . Essential hypertension 11/27/2016  . Sleep apnea 11/26/2016  . Paresthesia of bilateral legs 08/27/2015  . Family history of cardiac disorder in father 08/27/2015  . Insomnia 08/27/2015    History reviewed. No pertinent surgical history.     Home Medications    Prior to Admission medications   Medication Sig Start Date End Date Taking? Authorizing Provider  AMBULATORY NON FORMULARY MEDICATION Supply ordered: CPAP and other supplies needed (headgear, cushions, filters, heated tuubing and water chamber) Dx: obstructive sleep apnea Settings: auto-titration 5-25 mmHg 12/17/16   Emeterio Reeve, DO  enalapril (VASOTEC) 20 MG tablet Take 1 tablet (20 mg total)  daily by mouth. 12/24/16   Emeterio Reeve, DO  eszopiclone (LUNESTA) 1 MG TABS tablet Take 1 tablet (1 mg total) by mouth at bedtime as needed for sleep. Take immediately before bedtime 07/09/17   Trixie Dredge, PA-C  ibuprofen (ADVIL,MOTRIN) 600 MG tablet Take 600 mg by mouth every 6 (six) hours as needed.    [provider]  Melatonin 1 MG CAPS Take by mouth at bedtime.     [provider]  predniSONE (DELTASONE) 20 MG tablet 3 tabs po day one, then 2 po daily x 4 days 09/24/17   Noe Gens, PA-C  triamcinolone cream (KENALOG) 0.1 % Apply 1 application topically 2 (two) times daily. 09/24/17   Noe Gens, PA-C    Family History Family History  Problem Relation Age of Onset  . Cancer Mother   . Hypertension Father   . Heart attack Father     Social History Social History   Tobacco Use  . Smoking status: Never Smoker  . Smokeless tobacco: Never Used  Substance Use Topics  . Alcohol use: Yes    Alcohol/week: 0.0 standard drinks  . Drug use: Not Currently     Allergies   Patient has no known allergies.   Review of Systems Review of Systems  Musculoskeletal: Negative for arthralgias, joint swelling and myalgias.  Skin: Positive for rash. Negative for wound.     Physical Exam Triage Vital Signs ED Triage Vitals [09/24/17 0831]  Enc Vitals Group     BP (!) 144/88     Pulse Rate 95     Resp      Temp 98.2 F (36.8 C)     Temp Source Oral     SpO2 97 %     Weight (!) 306 lb (138.8 kg)     Height 6\' 2"  (1.88 m)     Head Circumference      Peak Flow      Pain Score 0     Pain Loc      Pain Edu?      Excl. in [place]?    No data found.  Updated Vital Signs BP (!) 144/88 (BP Location: Right Arm)   Pulse 95   Temp 98.2 F (36.8 C) (Oral)   Ht 6\' 2"  (1.88 m)   Wt (!) 306 lb (138.8 kg)   SpO2 97%   BMI 39.29 kg/m   Visual Acuity Right Eye Distance:   Left Eye Distance:   Bilateral Distance:    Right Eye Near:   Left Eye  Near:    Bilateral Near:     Physical Exam  Constitutional: He is oriented to person, place, and time. He appears well-developed and well-nourished. No distress.  HENT:  Head: Normocephalic and atraumatic.  Mouth/Throat: Oropharynx is clear and moist.  Eyes: EOM are normal.  Neck: Normal range of motion.  Cardiovascular: Normal rate.  Pulmonary/Chest: Effort normal. No respiratory distress.  Musculoskeletal: Normal range of motion.  Neurological: He is alert and oriented to person, place, and time.  Skin: Skin is warm and dry. Rash noted. He is not diaphoretic. There is erythema.  Diffuse erythematous maculopapular rash on Right arm, sparse on Left arm. Across trunk and faintly on legs. Rash does blanch. Non-tender.  Psychiatric: He has a normal mood and affect. His behavior is normal.  Nursing note and vitals reviewed.    UC Treatments / Results  Labs (all labs ordered are listed, but only abnormal results are displayed) Labs Reviewed - No data to display  EKG None  Radiology No results found.  Procedures Procedures (including critical care time)  Medications Ordered in UC Medications  methylPREDNISolone sodium succinate (SOLU-MEDROL) 40 mg/mL injection 80 mg (80 mg Intramuscular Given 09/24/17 0849)    Initial Impression / Assessment and Plan / UC Course  I have reviewed the triage vital signs and the nursing notes.  Pertinent labs & imaging results that were available during my care of the patient were reviewed by me and considered in my medical decision making (see chart for details).     nondescript rash. Possibly contact dermatitis Will try trial of steroids  Final Clinical Impressions(s) / UC Diagnoses   Final diagnoses:  Rash and nonspecific skin eruption     Discharge Instructions      You were given a shot of solumedrol (a steroid) today to help with itching and swelling from a likely allergic reaction.  You have been prescribed 5 days of  prednisone, an oral steroid.  You may start this medication tomorrow with breakfast.    Please follow up with family medicine in 1 week if not improving, sooner if worsening.     ED Prescriptions    Medication Sig Dispense Auth. Provider   predniSONE (DELTASONE) 20 MG tablet 3 tabs po day one, then 2 po daily x 4 days 11 tablet Gerarda Fraction, Davinity Fanara O, PA-C   triamcinolone cream (KENALOG) 0.1 % Apply 1 application topically 2 (two)  times daily. 30 g Noe Gens, PA-C     Controlled Substance Prescriptions [place] Controlled Substance Registry consulted? Not Applicable   Tyrell Antonio 09/24/17 1012

## 2017-10-20 ENCOUNTER — Encounter: Payer: Self-pay | Admitting: Emergency Medicine

## 2017-10-20 ENCOUNTER — Emergency Department
Admission: EM | Admit: 2017-10-20 | Discharge: 2017-10-20 | Disposition: A | Discharge status: Home / Self Care | Payer: 59 | Source: Home / Self Care | Attending: Family Medicine | Admitting: Family Medicine

## 2017-10-20 DIAGNOSIS — L255 Unspecified contact dermatitis due to plants, except food: Secondary | ICD-10-CM

## 2017-10-20 MED ORDER — METHYLPREDNISOLONE SODIUM SUCC 125 MG IJ SOLR
80.0000 mg | Freq: Once | INTRAMUSCULAR | Status: AC
  Administered 2017-10-20: 80 mg via INTRAMUSCULAR

## 2017-10-20 MED ORDER — PREDNISONE 20 MG PO TABS: ORAL_TABLET | ORAL | 0 refills | Status: DC

## 2017-10-20 NOTE — Discharge Instructions (Addendum)
Begin prednisone Thursday 10/21/17. May take Benadryl at bedtime for itching.

## 2017-10-20 NOTE — ED Provider Notes (Signed)
[facility]    CSN: [id number] Arrival date & time: 10/20/17  1947     History   Chief Complaint Chief Complaint  Patient presents with  . Rash    HPI Wesley Pacheco is a 37 y.o. male.   Patient came into contact with poison ivy on his property 3 days ago and now has developed mild swelling, itching, and erythema of his face.  He has scattered small pruritic lesions on his arms.  He feels well otherwise.  The history is provided by the patient.  Poison Karlene Einstein  This is a recurrent problem. The problem occurs constantly. The problem has been gradually worsening. Exacerbated by: heat. Nothing relieves the symptoms. He has tried nothing for the symptoms.    Past Medical History:  Diagnosis Date  . Essential hypertension 11/27/2016  . Family history of cardiac disorder in father 08/27/2015  . Insomnia 08/27/2015  . Sleep apnea 11/26/2016   On CPAP, last sleep study approximately 5-7 years ago     Patient Active Problem List   Diagnosis Date Noted  . 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 Benign paroxysmal positional vertigo 07/09/2017  . Mild hyperlipidemia 12/10/2016  . Elevated ALT measurement 12/10/2016  . Benign nevus of skin 11/27/2016  . Essential hypertension 11/27/2016  . Sleep apnea 11/26/2016  . Paresthesia of bilateral legs 08/27/2015  . Family history of cardiac disorder in father 08/27/2015  . Insomnia 08/27/2015    History reviewed. No pertinent surgical history.     Home Medications    Prior to Admission medications   Medication Sig Start Date End Date Taking? Authorizing Provider  AMBULATORY NON FORMULARY MEDICATION Supply ordered: CPAP and other supplies needed (headgear, cushions, filters, heated tuubing and water chamber) Dx: obstructive sleep apnea Settings: auto-titration 5-25 mmHg 12/17/16   Emeterio Reeve, DO  enalapril (VASOTEC) 20 MG tablet Take 1 tablet (20 mg total) daily by mouth. 12/24/16   Emeterio Reeve, DO    eszopiclone (LUNESTA) 1 MG TABS tablet Take 1 tablet (1 mg total) by mouth at bedtime as needed for sleep. Take immediately before bedtime 07/09/17   Trixie Dredge, PA-C  ibuprofen (ADVIL,MOTRIN) 600 MG tablet Take 600 mg by mouth every 6 (six) hours as needed.    [provider]  Melatonin 1 MG CAPS Take by mouth at bedtime.     [provider]  predniSONE (DELTASONE) 20 MG tablet Take one tab by mouth twice daily for 5 days, then one daily for 3 days. Take with food. 10/20/17   Kandra Nicolas, MD  triamcinolone cream (KENALOG) 0.1 % Apply 1 application topically 2 (two) times daily. 09/24/17   Noe Gens, PA-C    Family History Family History  Problem Relation Age of Onset  . Cancer Mother   . Hypertension Father   . Heart attack Father     Social History Social History   Tobacco Use  . Smoking status: Never Smoker  . Smokeless tobacco: Never Used  Substance Use Topics  . Alcohol use: Yes    Alcohol/week: 0.0 standard drinks  . Drug use: Not Currently     Allergies   Patient has no known allergies.   Review of Systems Review of Systems  All other systems reviewed and are negative.    Physical Exam Triage Vital Signs ED Triage Vitals [10/20/17 2005]  Enc Vitals Group     BP (!) 138/101     Pulse Rate 78     Resp  Temp 97.6 F (36.4 C)     Temp Source Oral     SpO2 96 %     Weight (!) 310 lb (140.6 kg)     Height      Head Circumference      Peak Flow      Pain Score 0     Pain Loc      Pain Edu?      Excl. in [place]?    No data found.  Updated Vital Signs BP (!) 138/101 (BP Location: Right Arm)   Pulse 78   Temp 97.6 F (36.4 C) (Oral)   Wt (!) 140.6 kg   SpO2 96%   BMI 39.80 kg/m   Visual Acuity Right Eye Distance:   Left Eye Distance:   Bilateral Distance:    Right Eye Near:   Left Eye Near:    Bilateral Near:     Physical Exam  Constitutional: He appears well-developed and well-nourished. No  distress.  HENT:  Head: Normocephalic.  Right Ear: External ear normal.  Left Ear: External ear normal.  Nose: Nose normal.  Mouth/Throat: Oropharynx is clear and moist.  Eyes: Pupils are equal, round, and reactive to light. Conjunctivae are normal.  Neck: Neck supple.  Cardiovascular: Normal rate.  Pulmonary/Chest: Effort normal.  Musculoskeletal: He exhibits no edema.  Lymphadenopathy:    He has no cervical adenopathy.  Neurological: He is alert.  Skin: Skin is warm and dry.     Mild macular erythema and swelling of face without tenderness to palpation.  Upper extremities have scattered linear erythematous lesions.  No vesicles.  Nursing note and vitals reviewed.    UC Treatments / Results  Labs (all labs ordered are listed, but only abnormal results are displayed) Labs Reviewed - No data to display  EKG None  Radiology No results found.  Procedures Procedures (including critical care time)  Medications Ordered in UC Medications  methylPREDNISolone sodium succinate (SOLU-MEDROL) 125 mg/2 mL injection 80 mg (has no administration in time range)    Initial Impression / Assessment and Plan / UC Course  I have reviewed the triage vital signs and the nursing notes.  Pertinent labs & imaging results that were available during my care of the patient were reviewed by me and considered in my medical decision making (see chart for details).    Administered Solumedrol 80mg  IM. Begin prednisone burst/taper. Followup with Family Doctor if not improved in one week.    Final Clinical Impressions(s) / UC Diagnoses   Final diagnoses:  Rhus dermatitis     Discharge Instructions     Begin prednisone Thursday 10/21/17. May take Benadryl at bedtime for itching.   ED Prescriptions    Medication Sig Dispense Auth. Provider   predniSONE (DELTASONE) 20 MG tablet Take one tab by mouth twice daily for 5 days, then one daily for 3 days. Take with food. 13 tablet Kandra Nicolas, MD        Kandra Nicolas, MD 10/20/17 [phone]

## 2017-10-20 NOTE — ED Triage Notes (Signed)
Pt c/o rash x2 days on entire body.

## 2018-07-25 ENCOUNTER — Other Ambulatory Visit: Payer: Self-pay

## 2018-07-25 ENCOUNTER — Encounter: Payer: Self-pay | Admitting: Sports Medicine

## 2018-07-25 ENCOUNTER — Ambulatory Visit (INDEPENDENT_AMBULATORY_CARE_PROVIDER_SITE_OTHER): Payer: 59

## 2018-07-25 ENCOUNTER — Ambulatory Visit (INDEPENDENT_AMBULATORY_CARE_PROVIDER_SITE_OTHER): Payer: 59 | Admitting: Sports Medicine

## 2018-07-25 DIAGNOSIS — S8992XA Unspecified injury of left lower leg, initial encounter: Secondary | ICD-10-CM

## 2018-07-25 DIAGNOSIS — M7652 Patellar tendinitis, left knee: Secondary | ICD-10-CM | POA: Diagnosis not present

## 2018-07-25 MED ORDER — HYDROCODONE-ACETAMINOPHEN 5-325 MG PO TABS: 1.0000 | ORAL_TABLET | Freq: Three times a day (TID) | ORAL | 0 refills | Status: DC | PRN

## 2018-07-25 NOTE — Addendum Note (Signed)
Addended by: Silverio Decamp on: 07/25/2018 03:45 PM   Modules accepted: Orders

## 2018-07-25 NOTE — Assessment & Plan Note (Addendum)
Pain present now for about a month and a half. Severe pain at the lateral origin of the patellar tendon, significant intratendinous calcifications and a defect in the patella seen on ultrasound. Knee immobilizer, hydrocodone, x-rays. I do suspect will be needing an MRI.  X-rays are nondiagnostic, suspect patellar tendon tearing, ordering MRI.  Adding topical nitroglycerin one quarter patch applied every 24 hours to the most painful spot, as well as aggressive formal physical therapy.

## 2018-07-25 NOTE — Progress Notes (Addendum)
Subjective:    CC: Severe left knee pain  HPI: For the past month or 2 this pleasant 38 year old male has had pain, severe that he localizes on the anterior aspect of his left knee, localized at the patellar tendon origin at the kneecap.  He does not recall any recent trauma.  He had a short period of time where symptoms were better, and then had a severe recurrence of pain.  It is okay when he keeps the knee straight but he has severe discomfort when he bends it or fires his quad.  I reviewed the past medical history, family history, social history, surgical history, and allergies today and no changes were needed.  Please see the problem list section below in epic for further details.  Past Medical History: Past Medical History:  Diagnosis Date  . Essential hypertension 11/27/2016  . Family history of cardiac disorder in father 08/27/2015  . Insomnia 08/27/2015  . Sleep apnea 11/26/2016   On CPAP, last sleep study approximately 5-7 years ago    Past Surgical History: No past surgical history on file. Social History: Social History   Socioeconomic History  . Marital status: Married    Spouse name: Not on file  . Number of children: Not on file  . Years of education: Not on file  . Highest education level: Not on file  Occupational History  . Not on file  Social Needs  . Financial resource strain: Not on file  . Food insecurity    Worry: Not on file    Inability: Not on file  . Transportation needs    Medical: Not on file    Non-medical: Not on file  Tobacco Use  . Smoking status: Never Smoker  . Smokeless tobacco: Never Used  Substance and Sexual Activity  . Alcohol use: Yes    Alcohol/week: 0.0 standard drinks  . Drug use: Not Currently  . Sexual activity: Not Currently  Lifestyle  . Physical activity    Days per week: Not on file    Minutes per session: Not on file  . Stress: Not on file  Relationships  . Social Herbalist on phone: Not on file   Gets together: Not on file    Attends religious service: Not on file    Active member of club or organization: Not on file    Attends meetings of clubs or organizations: Not on file    Relationship status: Not on file  Other Topics Concern  . Not on file  Social History Narrative  . Not on file   Family History: Family History  Problem Relation Age of Onset  . Cancer Mother   . Hypertension Father   . Heart attack Father    Allergies: No Known Allergies Medications: See med rec.  Review of Systems: No fevers, chills, night sweats, weight loss, chest pain, or shortness of breath.   Objective:    General: Well Developed, well nourished, and in no acute distress.  Neuro: Alert and oriented x3, extra-ocular muscles intact, sensation grossly intact.  HEENT: Normocephalic, atraumatic, pupils equal round reactive to light, neck supple, no masses, no lymphadenopathy, thyroid nonpalpable.  Skin: Warm and dry, no rashes. Cardiac: Regular rate and rhythm, no murmurs rubs or gallops, no lower extremity edema.  Respiratory: Clear to auscultation bilaterally. Not using accessory muscles, speaking in full sentences. Left knee: Normal to inspection with no erythema or effusion or obvious bony abnormalities. Exquisite tenderness palpation at the patellar tendon origin  laterally ROM normal in flexion and extension and lower leg rotation. Ligaments with solid consistent endpoints including ACL, PCL, LCL, MCL. Negative Mcmurray's and provocative meniscal tests. Non painful patellar compression. Patellar and quadriceps tendons unremarkable. Hamstring and quadriceps strength is normal.  Procedure: Diagnostic Ultrasound of left knee Device: GE Logiq E  Findings: Heterogeneity of the lateral proximal patellar tendon with insertional osteophytosis and possible avulsion from the patella Images permanently stored and available for review in the ultrasound unit.  Impression: Proximal patellar  tendinosis with possible avulsion fracture  Impression and Recommendations:    Patellar tendinitis, left knee, proximal Pain present now for about a month and a half. Severe pain at the lateral origin of the patellar tendon, significant intratendinous calcifications and a defect in the patella seen on ultrasound. Knee immobilizer, hydrocodone, x-rays. I do suspect will be needing an MRI.  X-rays are nondiagnostic, suspect patellar tendon tearing, ordering MRI.  Adding topical nitroglycerin one quarter patch applied every 24 hours to the most painful spot, as well as aggressive formal physical therapy.   ___________________________________________ Gwen Her. Dianah Field, M.D., ABFM., CAQSM. Primary Care and Sports Medicine [facility]  Adjunct Professor of [employer]

## 2018-07-31 ENCOUNTER — Ambulatory Visit (INDEPENDENT_AMBULATORY_CARE_PROVIDER_SITE_OTHER): Payer: 59

## 2018-07-31 ENCOUNTER — Other Ambulatory Visit: Payer: Self-pay

## 2018-07-31 DIAGNOSIS — S8992XA Unspecified injury of left lower leg, initial encounter: Secondary | ICD-10-CM | POA: Diagnosis not present

## 2018-08-03 MED ORDER — NITROGLYCERIN 0.2 MG/HR TD PT24: MEDICATED_PATCH | TRANSDERMAL | 11 refills | Status: DC

## 2018-08-03 NOTE — Addendum Note (Signed)
Addended by: Silverio Decamp on: 08/03/2018 11:25 AM   Modules accepted: Orders

## 2018-08-05 ENCOUNTER — Ambulatory Visit: Payer: 59 | Admitting: Physical Therapy

## 2018-08-05 ENCOUNTER — Encounter: Payer: Self-pay | Admitting: Physical Therapy

## 2018-08-05 ENCOUNTER — Other Ambulatory Visit: Payer: Self-pay

## 2018-08-05 DIAGNOSIS — M25562 Pain in left knee: Secondary | ICD-10-CM | POA: Diagnosis not present

## 2018-08-05 DIAGNOSIS — M6281 Muscle weakness (generalized): Secondary | ICD-10-CM

## 2018-08-05 NOTE — Patient Instructions (Signed)
Access Code: [email or web address]  URL: [email or web address]  Date: 08/05/2018  Prepared by: Lyndee Hensen   Exercises Supine Heel Slide - 10 reps - 1 sets - 3 hold - 2x daily Long Sitting Quad Set - 10 reps - 2 sets - 3 hold - 2x daily Seated Knee Extension AROM - 10 reps - 1 sets - 3 hold - 2x daily

## 2018-08-05 NOTE — Therapy (Signed)
[place], [place], [address] Phone: [phone]   Fax:  [phone]  Physical Therapy Evaluation  Patient Details  Name: Wesley Pacheco MRN: [id number] Date of Birth: 06/10/1980 Referring Provider (PT): Dr. Dianah Field   Encounter Date: 08/05/2018  PT End of Session - 08/05/18 1308    Visit Number  1    Number of Visits  12    Date for PT Re-Evaluation  09/16/18    Authorization Type  UHC    PT Start Time  7893    PT Stop Time  1046    PT Time Calculation (min)  44 min    Activity Tolerance  Patient tolerated treatment well;Patient limited by pain    Behavior During Therapy  [facility] for tasks assessed/performed       Past Medical History:  Diagnosis Date  . Essential hypertension 11/27/2016  . Family history of cardiac disorder in father 08/27/2015  . Insomnia 08/27/2015  . Sleep apnea 11/26/2016   On CPAP, last sleep study approximately 5-7 years ago     History reviewed. No pertinent surgical history.  There were no vitals filed for this visit.   Subjective Assessment - 08/05/18 1009    Subjective  Pt states increased pain in the past, currently "flared up". He notes increased swelling and pain, no incident to report. He does work, Land. Climbing, loading, getting in/out of semi trucks. Has had previous back pain with L LE pain, has had nerves "burnt", with good resolution of LE pain, but does have lower leg pins and needles since surgery.  He also had drop foot, that is now improved. He has had recent MRI, has follow up with MD on Monday.    Limitations  Lifting;Standing;Walking;House hold activities    Patient Stated Goals  Decreased pain, increased mobility    Currently in Pain?  Yes    Pain Score  7     Pain Location  Knee    Pain Orientation  Left    Pain Descriptors / Indicators  Aching    Pain Type  Chronic pain    Pain Onset  1 to 4 weeks ago    Pain  Frequency  Intermittent    Aggravating Factors   Bending, crossing leg, standing, walking.    Pain Relieving Factors  rest         [facility] PT Assessment - 08/05/18 0001      Assessment   Medical Diagnosis  L Knee pain    Referring Provider (PT)  Dr. Dianah Field    Next MD Visit  08/08/18    Prior Therapy  not for knee      Restrictions   Weight Bearing Restrictions  No      Balance Screen   Has the patient fallen in the past 6 months  No      Prior Function   Level of Independence  Independent      Cognition   Overall Cognitive Status  Within Functional Limits for tasks assessed      ROM / Strength   AROM / PROM / Strength  AROM;Strength      AROM   Overall AROM Comments  L knee flex: AROM: 75 deg, supine, due to pain;   Ext:       Strength   Overall Strength Comments  L quad: 3-/5;  Hamstring: 4/5,   L hip flex: 4-/5, Abd: 4-/5      Palpation  Palpation comment  Mild pain at L patella tendon, pain at lateral joint line, and at lateral patella , moderate tenderness at distal quad as well.      Special Tests   Other special tests  Neg meniscal testing;  Very painful attempts for full flexion/heel slide;                  Objective measurements completed on examination: See above findings.      [facility] PT Treatment/Exercise - 08/05/18 0001      Exercises   Exercises  Knee/Hip      Knee/Hip Exercises: Seated   Long Arc Quad  5 reps    Long Arc Quad Limitations  Partial ROM      Knee/Hip Exercises: Supine   Quad Sets  10 reps    Heel Slides  10 reps      Manual Therapy   Manual Therapy  Soft tissue mobilization;Taping   K-Tape: Y for quad activation and I strip for patella tendon   Soft tissue mobilization  IASTM to distal quad and patella tendon             PT Education - 08/05/18 1308    Education Details  PT POC, exam findings, HEP, K-Tape use/precautions.    Person(s) Educated  Patient    Methods   Explanation;Demonstration;Tactile cues;Verbal cues;Handout    Comprehension  Verbalized understanding       PT Short Term Goals - 08/05/18 1310      PT SHORT TERM GOAL #1   Title  Pt to be independent with initial HEP    Time  2    Period  Weeks    Status  New    Target Date  08/19/18      PT SHORT TERM GOAL #2   Title  Pt to demo improved ability for quad activation, to perform full LAQ against gravity.    Time  2    Period  Weeks    Status  New    Target Date  08/19/18      PT SHORT TERM GOAL #3   Title  Pt to demo improved knee flexion AROM to be WNL.    Time  3    Period  Weeks    Status  New    Target Date  08/26/18        PT Long Term Goals - 08/05/18 1312      PT LONG TERM GOAL #1   Title  Pt to be independent with final HEP    Time  6    Period  Weeks    Status  New    Target Date  09/16/18      PT LONG TERM GOAL #2   Title  Pt to report decreased pain in L knee, to 0-2/10 with activity    Time  6    Period  Weeks    Status  New    Target Date  09/16/18      PT LONG TERM GOAL #3   Title  Pt to demo improved strength of L quad and hip, to at least 4+/5 to improve stability and ambulation.    Time  6    Period  Weeks    Status  New    Target Date  09/16/18      PT LONG TERM GOAL #4   Title  Pt to demo ability for squat and lift without pain or deficit, to improve ability  for work duties.    Time  6    Period  Weeks    Status  New    Target Date  09/16/18             Plan - 08/05/18 1315    Clinical Impression Statement  Pt presents with primary complaint of increased pain in L Knee. Pt with sigificant lack of AROM, due to pain. Able to achieve full PROM, also with pain. Pt with significant decrease in L quad strength, able to get active contraction for quad set, but unable to lift against gravity for LAQ or to do SLR. Pt with increased pain at patella tendon region and lateral joint line. Pt with decreased gait mechanics, and poor stability  and NMC in R LE. He may have had some underlying weakness form previous back surgery and nerve damage as well. Pt with decreased ability for full functional activities. Pt to benefit from skilled PT to improve deficits and return to pain free, PLOF.    Personal Factors and Comorbidities  Past/Current Experience;Comorbidity 1    Examination-Activity Limitations  Carry;Squat;Stairs;Stand;Lift    Examination-Participation Restrictions  Cleaning;Community Activity;Driving    Stability/Clinical Decision Making  Evolving/Moderate complexity    Clinical Decision Making  Moderate    Rehab Potential  Good    PT Frequency  2x / week    PT Duration  6 weeks    PT Treatment/Interventions  ADLs/Self Care Home Management;Cryotherapy;Electrical Stimulation;DME Instruction;Ultrasound;Moist Heat;Iontophoresis 4mg /ml Dexamethasone;Gait training;Stair training;Functional mobility training;Therapeutic activities;Therapeutic exercise;Balance training;Orthotic Fit/Training;Patient/family education;Neuromuscular re-education;Manual techniques;Passive range of motion;Taping;Vasopneumatic Device;Dry needling;Spinal Manipulations;Joint Manipulations    Consulted and Agree with Plan of Care  Patient       Patient will benefit from skilled therapeutic intervention in order to improve the following deficits and impairments:  Abnormal gait, Decreased range of motion, Difficulty walking, Increased muscle spasms, Decreased endurance, Decreased activity tolerance, Pain, Decreased balance, Hypomobility, Decreased strength, Decreased mobility  Visit Diagnosis: 1. Acute pain of left knee   2. Muscle weakness (generalized)        Problem List Patient Active Problem List   Diagnosis Date Noted  . Patellar tendinitis, left knee, proximal 07/25/2018  . 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 Benign paroxysmal positional vertigo 07/09/2017  . Mild hyperlipidemia 12/10/2016  . Elevated ALT measurement 12/10/2016  . Benign  nevus of skin 11/27/2016  . Essential hypertension 11/27/2016  . Sleep apnea 11/26/2016  . Paresthesia of bilateral legs 08/27/2015  . Family history of cardiac disorder in father 08/27/2015  . Insomnia 08/27/2015    Lyndee Hensen, PT, DPT 1:29 PM  08/05/18    [facility] [place], [place], [address] Phone: [phone]   Fax:  [phone]  Name: Wesley Pacheco MRN: [id number] Date of Birth: April 28, 1980

## 2018-08-08 ENCOUNTER — Encounter: Payer: Self-pay | Admitting: Sports Medicine

## 2018-08-08 ENCOUNTER — Ambulatory Visit (INDEPENDENT_AMBULATORY_CARE_PROVIDER_SITE_OTHER): Payer: 59 | Admitting: Sports Medicine

## 2018-08-08 DIAGNOSIS — S8992XA Unspecified injury of left lower leg, initial encounter: Secondary | ICD-10-CM

## 2018-08-08 DIAGNOSIS — M7652 Patellar tendinitis, left knee: Secondary | ICD-10-CM

## 2018-08-08 MED ORDER — HYDROCODONE-ACETAMINOPHEN 5-325 MG PO TABS: 1.0000 | ORAL_TABLET | Freq: Every evening | ORAL | 0 refills | Status: DC | PRN

## 2018-08-08 NOTE — Progress Notes (Signed)
Subjective:    CC: Follow-up  HPI: This is a pleasant 38 year old male, we have been treating him for severe left knee pain, he has multifactorial knee pain related to arthritis, patellar tendinitis, quad tendinitis, as well as prepatellar bursitis.  He is improving little by little with physical therapy, he did accidentally do a half patch of nitroglycerin and got a severe headache.  I reviewed the past medical history, family history, social history, surgical history, and allergies today and no changes were needed.  Please see the problem list section below in epic for further details.  Past Medical History: Past Medical History:  Diagnosis Date  . Essential hypertension 11/27/2016  . Family history of cardiac disorder in father 08/27/2015  . Insomnia 08/27/2015  . Sleep apnea 11/26/2016   On CPAP, last sleep study approximately 5-7 years ago    Past Surgical History: No past surgical history on file. Social History: Social History   Socioeconomic History  . Marital status: Married    Spouse name: Not on file  . Number of children: Not on file  . Years of education: Not on file  . Highest education level: Not on file  Occupational History  . Not on file  Social Needs  . Financial resource strain: Not on file  . Food insecurity    Worry: Not on file    Inability: Not on file  . Transportation needs    Medical: Not on file    Non-medical: Not on file  Tobacco Use  . Smoking status: Never Smoker  . Smokeless tobacco: Never Used  Substance and Sexual Activity  . Alcohol use: Yes    Alcohol/week: 0.0 standard drinks  . Drug use: Not Currently  . Sexual activity: Not Currently  Lifestyle  . Physical activity    Days per week: Not on file    Minutes per session: Not on file  . Stress: Not on file  Relationships  . Social Herbalist on phone: Not on file    Gets together: Not on file    Attends religious service: Not on file    Active member of club or  organization: Not on file    Attends meetings of clubs or organizations: Not on file    Relationship status: Not on file  Other Topics Concern  . Not on file  Social History Narrative  . Not on file   Family History: Family History  Problem Relation Age of Onset  . Cancer Mother   . Hypertension Father   . Heart attack Father    Allergies: No Known Allergies Medications: See med rec.  Review of Systems: No fevers, chills, night sweats, weight loss, chest pain, or shortness of breath.   Objective:    General: Well Developed, well nourished, and in no acute distress.  Neuro: Alert and oriented x3, extra-ocular muscles intact, sensation grossly intact.  HEENT: Normocephalic, atraumatic, pupils equal round reactive to light, neck supple, no masses, no lymphadenopathy, thyroid nonpalpable.  Skin: Warm and dry, no rashes. Cardiac: Regular rate and rhythm, no murmurs rubs or gallops, no lower extremity edema.  Respiratory: Clear to auscultation bilaterally. Not using accessory muscles, speaking in full sentences.  Impression and Recommendations:    Patellar tendinitis, left knee, proximal Continue physical therapy. MRI showed multiple pathologic changes including osteoarthritis, prepatellar bursitis, proximal patellar tendinosis and well as distal quadriceps tendinitis. He did a half patch of nitroglycerin by accident and got a headache, he will drop back down  to a quarter patch, continue PT. Refilling hydrocodone. Ice the prepatellar bursa 20 minutes 3-4 times a day. Return to see me in 4 weeks.   ___________________________________________ Gwen Her. Dianah Field, M.D., ABFM., CAQSM. Primary Care and Sports Medicine [facility]  Adjunct Professor of [employer]

## 2018-08-08 NOTE — Assessment & Plan Note (Signed)
Continue physical therapy. MRI showed multiple pathologic changes including osteoarthritis, prepatellar bursitis, proximal patellar tendinosis and well as distal quadriceps tendinitis. He did a half patch of nitroglycerin by accident and got a headache, he will drop back down to a quarter patch, continue PT. Refilling hydrocodone. Ice the prepatellar bursa 20 minutes 3-4 times a day. Return to see me in 4 weeks.

## 2018-08-10 ENCOUNTER — Ambulatory Visit: Payer: 59 | Admitting: Physical Therapy

## 2018-08-10 ENCOUNTER — Encounter: Payer: Self-pay | Admitting: Physical Therapy

## 2018-08-10 ENCOUNTER — Other Ambulatory Visit: Payer: Self-pay

## 2018-08-10 DIAGNOSIS — M25562 Pain in left knee: Secondary | ICD-10-CM | POA: Diagnosis not present

## 2018-08-10 DIAGNOSIS — M6281 Muscle weakness (generalized): Secondary | ICD-10-CM | POA: Diagnosis not present

## 2018-08-10 NOTE — Therapy (Signed)
[place], [place], [address] Phone: [phone]   Fax:  [phone]  Physical Therapy Treatment  Patient Details  Name: Wesley Pacheco MRN: [id number] Date of Birth: [birth year] Referring Provider (PT): Dr. Dianah Field   Encounter Date: 08/10/2018  PT End of Session - 08/10/18 0809    Visit Number  2    Number of Visits  12    Date for PT Re-Evaluation  09/16/18    Authorization Type  UHC    PT Start Time  0802    PT Stop Time  0846    PT Time Calculation (min)  44 min    Activity Tolerance  Patient tolerated treatment well;Patient limited by pain    Behavior During Therapy  [facility] for tasks assessed/performed       Past Medical History:  Diagnosis Date  . Essential hypertension 11/27/2016  . Family history of cardiac disorder in father 08/27/2015  . Insomnia 08/27/2015  . Sleep apnea 11/26/2016   On CPAP, last sleep study approximately 5-7 years ago     History reviewed. No pertinent surgical history.  There were no vitals filed for this visit.  Subjective Assessment - 08/10/18 0806    Subjective  Pt states continued pain, but is better at rest. States movement is improving. Had follow up with MD on Monday, will continue PT.    Patient Stated Goals  Decreased pain, increased mobility    Currently in Pain?  Yes    Pain Score  7     Pain Location  Knee    Pain Orientation  Left    Pain Descriptors / Indicators  Aching;Stabbing    Pain Type  Chronic pain    Pain Onset  1 to 4 weeks ago    Pain Frequency  Intermittent                       [facility] Adult PT Treatment/Exercise - 08/10/18 0810      Exercises   Exercises  Knee/Hip      Knee/Hip Exercises: Aerobic   Stationary Bike  L1 x 8  min      Knee/Hip Exercises: Standing   Forward Step Up  Step Height: 4";10 reps;Left    Other Standing Knee Exercises  Standing march x20;  SLS/Clock exercise x15 , standing on L.        Knee/Hip Exercises: Seated   Long Arc Quad  15 reps    Long Arc Quad Limitations  --      Knee/Hip Exercises: Supine   Quad Sets  20 reps    Heel Slides  20 reps    Straight Leg Raises  10 reps;2 sets;Left      Knee/Hip Exercises: Sidelying   Hip ABduction  10 reps;2 sets;Left      Manual Therapy   Manual Therapy  Soft tissue mobilization;Taping   K-Tape: Y for quad activation and I strip for patella tendon   Soft tissue mobilization  IASTM to L Quad               PT Short Term Goals - 08/05/18 1310      PT SHORT TERM GOAL #1   Title  Pt to be independent with initial HEP    Time  2    Period  Weeks    Status  New    Target Date  08/19/18      PT SHORT TERM GOAL #2  Title  Pt to demo improved ability for quad activation, to perform full LAQ against gravity.    Time  2    Period  Weeks    Status  New    Target Date  08/19/18      PT SHORT TERM GOAL #3   Title  Pt to demo improved knee flexion AROM to be WNL.    Time  3    Period  Weeks    Status  New    Target Date  08/26/18        PT Long Term Goals - 08/05/18 1312      PT LONG TERM GOAL #1   Title  Pt to be independent with final HEP    Time  6    Period  Weeks    Status  New    Target Date  09/16/18      PT LONG TERM GOAL #2   Title  Pt to report decreased pain in L knee, to 0-2/10 with activity    Time  6    Period  Weeks    Status  New    Target Date  09/16/18      PT LONG TERM GOAL #3   Title  Pt to demo improved strength of L quad and hip, to at least 4+/5 to improve stability and ambulation.    Time  6    Period  Weeks    Status  New    Target Date  09/16/18      PT LONG TERM GOAL #4   Title  Pt to demo ability for squat and lift without pain or deficit, to improve ability for work duties.    Time  6    Period  Weeks    Status  New    Target Date  09/16/18            Plan - 08/10/18 0847    Clinical Impression Statement  Pt with improved ability for all exercises  today, quad set, SLR, and LAQ. He has noted instability in standing, and difficulty with SLS. Also has difficulty with stair climbing due to weakness and pain. Pt to benefit from contiued care and strengthening.    Personal Factors and Comorbidities  Past/Current Experience;Comorbidity 1    Examination-Activity Limitations  Carry;Squat;Stairs;Stand;Lift    Examination-Participation Restrictions  Cleaning;Community Activity;Driving    Stability/Clinical Decision Making  Evolving/Moderate complexity    Rehab Potential  Good    PT Frequency  2x / week    PT Duration  6 weeks    PT Treatment/Interventions  ADLs/Self Care Home Management;Cryotherapy;Electrical Stimulation;DME Instruction;Ultrasound;Moist Heat;Iontophoresis 4mg /ml Dexamethasone;Gait training;Stair training;Functional mobility training;Therapeutic activities;Therapeutic exercise;Balance training;Orthotic Fit/Training;Patient/family education;Neuromuscular re-education;Manual techniques;Passive range of motion;Taping;Vasopneumatic Device;Dry needling;Spinal Manipulations;Joint Manipulations    Consulted and Agree with Plan of Care  Patient       Patient will benefit from skilled therapeutic intervention in order to improve the following deficits and impairments:  Abnormal gait, Decreased range of motion, Difficulty walking, Increased muscle spasms, Decreased endurance, Decreased activity tolerance, Pain, Decreased balance, Hypomobility, Decreased strength, Decreased mobility  Visit Diagnosis: 1. Acute pain of left knee   2. Muscle weakness (generalized)        Problem List Patient Active Problem List   Diagnosis Date Noted  . Patellar tendinitis, left knee, proximal 07/25/2018  . 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 Benign paroxysmal positional vertigo 07/09/2017  . Mild hyperlipidemia 12/10/2016  . Elevated ALT measurement 12/10/2016  .  Benign nevus of skin 11/27/2016  . Essential hypertension 11/27/2016  . Sleep  apnea 11/26/2016  . Paresthesia of bilateral legs 08/27/2015  . Family history of cardiac disorder in father 08/27/2015  . Insomnia 08/27/2015    Lyndee Hensen, PT, DPT 8:49 AM  08/10/18    [facility] [place], [place], [address] Phone: [phone]   Fax:  [phone]  Name: Wesley Pacheco MRN: [id number] Date of Birth: May 03, 1980

## 2018-08-12 ENCOUNTER — Encounter: Payer: 59 | Admitting: Physical Therapy

## 2018-08-15 ENCOUNTER — Other Ambulatory Visit: Payer: Self-pay

## 2018-08-15 ENCOUNTER — Encounter: Payer: Self-pay | Admitting: Rehabilitative and Restorative Service Providers"

## 2018-08-15 ENCOUNTER — Ambulatory Visit: Payer: 59 | Admitting: Rehabilitative and Restorative Service Providers"

## 2018-08-15 DIAGNOSIS — M25562 Pain in left knee: Secondary | ICD-10-CM | POA: Diagnosis not present

## 2018-08-15 DIAGNOSIS — M6281 Muscle weakness (generalized): Secondary | ICD-10-CM | POA: Diagnosis not present

## 2018-08-15 NOTE — Therapy (Signed)
[place], [place], [address] Phone: [phone]   Fax:  [phone]  Physical Therapy Treatment  Patient Details  Name: Wesley Pacheco MRN: [id number] Date of Birth: 1980/12/24 Referring Provider (PT): Dr. Dianah Field   Encounter Date: 08/15/2018  PT End of Session - 08/15/18 0905    Visit Number  3    Number of Visits  12    Date for PT Re-Evaluation  09/16/18    PT Start Time  0903    PT Stop Time  0958    PT Time Calculation (min)  55 min    Activity Tolerance  Patient tolerated treatment well       Past Medical History:  Diagnosis Date  . Essential hypertension 11/27/2016  . Family history of cardiac disorder in father 08/27/2015  . Insomnia 08/27/2015  . Sleep apnea 11/26/2016   On CPAP, last sleep study approximately 5-7 years ago     History reviewed. No pertinent surgical history.  There were no vitals filed for this visit.  Subjective Assessment - 08/15/18 0906    Subjective  Patient reports that his knee pain "comes and goes". He feels that he can "use" his leg more. Tried some steps yesterday for exercise and the knee swelled and started hurting again. 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    Currently in Pain?  Yes    Pain Score  2     Pain Location  Knee    Pain Orientation  Left    Pain Descriptors / Indicators  Aching    Pain Type  Chronic pain    Pain Onset  More than a month ago    Pain Frequency  Intermittent    Aggravating Factors   bending; crossing leg; standing; walking    Pain Relieving Factors  rest         [facility] PT Assessment - 08/15/18 0001      Assessment   Medical Diagnosis  L Knee pain    Referring Provider (PT)  Dr. Dianah Field      AROM   Lumbar Flexion  70%    Lumbar Extension  50%    Lumbar - Right Side Bend  65%    Lumbar - Left Side Bend  65%    Lumbar - Right Rotation  50%    Lumbar - Left Rotation  50%       Strength   Overall Strength Comments  L quad: 3-/5;  Hamstring: 4/5,  L hip ext 4+/5;  L hip flex: 4-/5, Abd: 4-/5      Flexibility   Hamstrings  tight Lt > Rt     Quadriceps  tight Lt > Rt     ITB  tight Lt > Rt     Piriformis  tight Lt >> Rt       Palpation   Spinal mobility  tenderness with PA spring testing lower lumbar     Palpation comment  Mild pain at L patella tendon, pain at lateral joint line, and at lateral patella , moderate tenderness at distal quad as well.                   [facility] Adult PT Treatment/Exercise - 08/15/18 0001      Lumbar Exercises: Supine   Ab Set  --   trial of 4 part core - pt unable to perform correctly   AB Set  Limitations  worked on diaphragamatic breathing in supine with facilitation of PT; worked on diaphragmatic breathing with pelvic floor contraction and transverse abdominal activation; trial of lumbar multifi created lumbar mm spasm - will work toward the 4 part core       Knee/Hip Exercises: Hydrologist  Left;2 reps;30 seconds   supine with strap    Quad Stretch  Left;2 reps;30 seconds   prone with strap    ITB Stretch  Left;2 reps;30 seconds   supine with strap    Piriformis Stretch  Left;2 reps;30 seconds   supine travell      Knee/Hip Exercises: Aerobic   Stationary Bike  L2 x 6  min      Knee/Hip Exercises: Seated   Sit to Sand  5 reps   slow eccentric stand to sit      Manual Therapy   Manual therapy comments  instructed in myofacial release Lt quad; instructed in transverse friction massage lateral quad a insertion to patella     Kinesiotex  Facilitate Muscle;Inhibit Muscle   correction of patella; inhibit knee hyperextension      Kinesiotix   Inhibit Muscle   inhibit lateral quad; inhibit knee hyperextension     Facilitate Muscle   facilitate medial quad              PT Education - 08/15/18 1001    Education Details  HEP    Person(s) Educated  Patient    Methods   Explanation;Demonstration;Tactile cues;Verbal cues;Handout    Comprehension  Verbalized understanding;Returned demonstration;Verbal cues required;Tactile cues required       PT Short Term Goals - 08/05/18 1310      PT SHORT TERM GOAL #1   Title  Pt to be independent with initial HEP    Time  2    Period  Weeks    Status  New    Target Date  08/19/18      PT SHORT TERM GOAL #2   Title  Pt to demo improved ability for quad activation, to perform full LAQ against gravity.    Time  2    Period  Weeks    Status  New    Target Date  08/19/18      PT SHORT TERM GOAL #3   Title  Pt to demo improved knee flexion AROM to be WNL.    Time  3    Period  Weeks    Status  New    Target Date  08/26/18        PT Long Term Goals - 08/05/18 1312      PT LONG TERM GOAL #1   Title  Pt to be independent with final HEP    Time  6    Period  Weeks    Status  New    Target Date  09/16/18      PT LONG TERM GOAL #2   Title  Pt to report decreased pain in L knee, to 0-2/10 with activity    Time  6    Period  Weeks    Status  New    Target Date  09/16/18      PT LONG TERM GOAL #3   Title  Pt to demo improved strength of L quad and hip, to at least 4+/5 to improve stability and ambulation.    Time  6    Period  Weeks    Status  New  Target Date  09/16/18      PT LONG TERM GOAL #4   Title  Pt to demo ability for squat and lift without pain or deficit, to improve ability for work duties.    Time  6    Period  Weeks    Status  New    Target Date  09/16/18            Plan - 08/15/18 0905    Clinical Impression Statement  Evaluation of lumbar spine indicated core instability and poor quality movement patterns. Patient is compensating for core weakness. Note abnormal gaitl pattern; hyperextension of Lt knee in standing and with walking; limited trunk and LE mobility and ROM; abnormal movement patterns; poor tracking patella on Lt; lateral quad/hip tightness on Lt. Patient responded  well to taping and iniitated core stabilization program. Pain free end of treatment. Will benefit from program to address core strength and hip stability; prevent knee hyperextension.    Personal Factors and Comorbidities  Past/Current Experience;Comorbidity 1    Examination-Activity Limitations  Carry;Squat;Stairs;Stand;Lift    Examination-Participation Restrictions  Cleaning;Community Activity;Driving    Stability/Clinical Decision Making  Evolving/Moderate complexity    Rehab Potential  Good    PT Frequency  2x / week    PT Duration  6 weeks    PT Treatment/Interventions  ADLs/Self Care Home Management;Cryotherapy;Electrical Stimulation;DME Instruction;Ultrasound;Moist Heat;Iontophoresis 4mg /ml Dexamethasone;Gait training;Stair training;Functional mobility training;Therapeutic activities;Therapeutic exercise;Balance training;Orthotic Fit/Training;Patient/family education;Neuromuscular re-education;Manual techniques;Passive range of motion;Taping;Vasopneumatic Device;Dry needling;Spinal Manipulations;Joint Manipulations    PT Next Visit Plan  review new exercise program; progress with core stabilization; neuromuscular re-education for motor control; functional strengthening; DN/manual work lateral quad as needed    Consulted and Agree with Plan of Care  Patient       Patient will benefit from skilled therapeutic intervention in order to improve the following deficits and impairments:  Abnormal gait, Decreased range of motion, Difficulty walking, Increased muscle spasms, Decreased endurance, Decreased activity tolerance, Pain, Decreased balance, Hypomobility, Decreased strength, Decreased mobility  Visit Diagnosis: 1. Acute pain of left knee   2. Muscle weakness (generalized)        Problem List Patient Active Problem List   Diagnosis Date Noted  . Patellar tendinitis, left knee, proximal 07/25/2018  . 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 Benign paroxysmal positional vertigo  07/09/2017  . Mild hyperlipidemia 12/10/2016  . Elevated ALT measurement 12/10/2016  . Benign nevus of skin 11/27/2016  . Essential hypertension 11/27/2016  . Sleep apnea 11/26/2016  . Paresthesia of bilateral legs 08/27/2015  . Family history of cardiac disorder in father 08/27/2015  . Insomnia 08/27/2015    Sharronda Schweers Nilda Simmer PT, MPH  08/15/2018, 1:13 PM  [facility] [place], [place], [address] Phone: [phone]   Fax:  [phone]  Name: Wesley Pacheco MRN: [id number] Date of Birth: Jul 09, 1980

## 2018-08-15 NOTE — Patient Instructions (Signed)
Diaphragmatic    Hands on navel, inhale through nose making navel move out toward hands. Exhale through nose, hands follow exhalation in. Repeat __5-10_ times.    Abdominal Bracing With Pelvic Floor (Hook-Lying)    With neutral spine, tighten pelvic floor, suck belly button to back bone; tense muscles at waist, exhale slowly. Hold 10 sec slowly exhale  Repeat _10__ times. Do _several __ times a day. progress to do this sitting, standing, walking and with functional activities     HIP: Hamstrings - Supine  Place strap around foot. Raise leg up, keeping knee straight.  Bend opposite knee to protect back if indicated. Hold 30 seconds. 3 reps per set, 2-3 sets per day  Outer Hip Stretch: Reclined IT Band Stretch (Strap)   Strap around one foot, pull leg across body until you feel a pull or stretch in the outside of your hip, with shoulders on mat. Hold for 30 seconds. Repeat 3 times each leg. 2-3 times/day.  Piriformis Stretch   Lying on back, pull right knee toward opposite shoulder. Hold 30 seconds. Repeat 3 times. Do 2-3 sessions per day. Vary angles    Calf Stretch    Place hands on wall at shoulder height. Keeping back leg straight, bend front leg, feet pointing forward, heels flat on floor. Lean forward slightly until stretch is felt in calf of back leg. Hold stretch ___ seconds, breathing slowly in and out. Repeat stretch with other leg back. Do ___ sessions per day. Variation: Use chair or table for support.  Quads / HF, Supine   Lie near edge of bed, pull both knees up toward chest. Hold one knee as you drop the other leg off the edge of the bed.  Relax hanging knee/can bend knee back if indicated. Hold 30 seconds. Repeat 3 times per session. Do 2-3 sessions per day.  Quads / HF, Prone KNEE: Quadriceps - Prone    Place strap around ankle. Bring ankle toward buttocks. Press hip into surface. Hold 30 seconds. Repeat 3 times per session. Do 2-3 sessions per  day.

## 2018-08-17 ENCOUNTER — Ambulatory Visit: Payer: 59 | Admitting: Rehabilitative and Restorative Service Providers"

## 2018-08-17 ENCOUNTER — Encounter: Payer: Self-pay | Admitting: Rehabilitative and Restorative Service Providers"

## 2018-08-17 ENCOUNTER — Other Ambulatory Visit: Payer: Self-pay

## 2018-08-17 DIAGNOSIS — M6281 Muscle weakness (generalized): Secondary | ICD-10-CM

## 2018-08-17 DIAGNOSIS — M25562 Pain in left knee: Secondary | ICD-10-CM | POA: Diagnosis not present

## 2018-08-17 NOTE — Patient Instructions (Signed)
Access Code: [email or web address]  URL: [email or web address]  Date: 08/17/2018  Prepared by: Gillermo Murdoch   Exercises  Seated Cervical Retraction - 10 reps - 1 sets - 3x daily - 7x weekly  Standing Scapular Retraction - 10 reps - 1 sets - 10 hold - 3x daily - 7x weekly  Shoulder External Rotation and Scapular Retraction - 10 reps - 1 sets - hold - 3x daily - 7x weekly  Standing Scapular Retraction in Abduction - 10 reps - 1 sets - 3x daily - 7x weekly  Doorway Pec Stretch at 60 Degrees Abduction - 3 reps - 1 sets - 3x daily - 7x weekly  Doorway Pec Stretch at 90 Degrees Abduction - 3 reps - 1 sets - 30 seconds hold - 3x daily - 7x weekly  Doorway Pec Stretch at 120 Degrees Abduction - 3 reps - 1 sets - 30 second hold hold - 3x daily - 7x weekly  Anterior Shoulder and Biceps Stretch - 3 reps - 1 sets - 20-30 sec hold - 2x daily - 7x weekly  Bicep Stretch at Table - 3 reps - 1 sets - 30 sec hold - 2x daily - 7x weekly  Seated Shoulder Flexion AAROM with Pulley Behind - 10 reps - 1 sets - 10 sec hold - 1x daily - 7x weekly  Seated Shoulder Abduction AAROM with Pulley Behind - 10 reps - 1 sets - 10 sec hold - 1x daily - 7x weekly  Standing Shoulder Internal Rotation AAROM with Pulley - 10 reps - 1 sets - 10 sec hold - 1x daily - 7x weekly  Shoulder External Rotation and Scapular Retraction with Resistance - 10 reps - 3 sets - 1x daily - 7x weekly  Scapular Retraction with Resistance - 10 reps - 3 sets - 1x daily - 7x weekly  Scapular Retraction with Resistance Advanced - 10 reps - 3 sets - 1x daily - 7x weekly  Seated Thoracic Lumbar Extension - 3-5 reps - 1 sets - 30 sec hold - 2x daily - 7x weekly

## 2018-08-17 NOTE — Therapy (Signed)
[place], [place], [address] Phone: [phone]   Fax:  [phone]  Physical Therapy Treatment  Patient Details  Name: Wesley Pacheco MRN: [id number] Date of Birth: 07-20-1980 Referring Provider (PT): Dr. Dianah Field   Encounter Date: 08/17/2018  PT End of Session - 08/17/18 0907    Visit Number  4    Number of Visits  12    Date for PT Re-Evaluation  09/16/18    PT Start Time  0904    PT Stop Time  0957    PT Time Calculation (min)  53 min    Activity Tolerance  Patient tolerated treatment well       Past Medical History:  Diagnosis Date  . Essential hypertension 11/27/2016  . Family history of cardiac disorder in father 08/27/2015  . Insomnia 08/27/2015  . Sleep apnea 11/26/2016   On CPAP, last sleep study approximately 5-7 years ago     History reviewed. No pertinent surgical history.  There were no vitals filed for this visit.  Subjective Assessment - 08/17/18 0919    Subjective  Tape was "awesome" Knee felt much better. Can feel that the inside part of the quad is not working like the outside part of the quad    Currently in Pain?  Yes    Pain Score  2     Pain Location  Knee    Pain Orientation  Left    Pain Descriptors / Indicators  Aching                       [facility] Adult PT Treatment/Exercise - 08/17/18 0001      Neuro Re-ed    Neuro Re-ed Details   workingon standing with hip and knee control       Knee/Hip Exercises: Stretches   Passive Hamstring Stretch  Left;2 reps;30 seconds   supine with strap    Quad Stretch  Left;2 reps;30 seconds   prone with strap    Hip Flexor Stretch  Right;Left;4 reps;30 seconds   supine and seated    ITB Stretch  Left;2 reps;30 seconds   supine with strap    Piriformis Stretch  Left;2 reps;30 seconds   supine travell      Knee/Hip Exercises: Aerobic   Stationary Bike  L2 x 6  min      Knee/Hip Exercises: Supine   Quad Sets  Strengthening;Right;10 reps   10 sec hold roll under knee - focus on medial quad    Straight Leg Raise with External Rotation  Strengthening;Right;10 reps   10 sec hold slow eccentric lowering    Patellar Mobs  lateral to medial       Manual Therapy   Kinesiotex  Facilitate Muscle;Inhibit Muscle   correction of patella; inhibit knee hyperextension      Kinesiotix   Inhibit Muscle   inhibit lateral quad; inhibit knee hyperextension     Facilitate Muscle   facilitate medial quad              PT Education - 08/17/18 1006    Education Details  HEP    Person(s) Educated  Patient    Methods  Explanation;Demonstration;Tactile cues;Verbal cues;Handout    Comprehension  Verbalized understanding;Returned demonstration;Verbal cues required;Tactile cues required       PT Short Term Goals - 08/05/18 1310      PT SHORT TERM GOAL #1   Title  Pt to be independent with initial  HEP    Time  2    Period  Weeks    Status  New    Target Date  08/19/18      PT SHORT TERM GOAL #2   Title  Pt to demo improved ability for quad activation, to perform full LAQ against gravity.    Time  2    Period  Weeks    Status  New    Target Date  08/19/18      PT SHORT TERM GOAL #3   Title  Pt to demo improved knee flexion AROM to be WNL.    Time  3    Period  Weeks    Status  New    Target Date  08/26/18        PT Long Term Goals - 08/05/18 1312      PT LONG TERM GOAL #1   Title  Pt to be independent with final HEP    Time  6    Period  Weeks    Status  New    Target Date  09/16/18      PT LONG TERM GOAL #2   Title  Pt to report decreased pain in L knee, to 0-2/10 with activity    Time  6    Period  Weeks    Status  New    Target Date  09/16/18      PT LONG TERM GOAL #3   Title  Pt to demo improved strength of L quad and hip, to at least 4+/5 to improve stability and ambulation.    Time  6    Period  Weeks    Status  New    Target Date  09/16/18      PT LONG TERM  GOAL #4   Title  Pt to demo ability for squat and lift without pain or deficit, to improve ability for work duties.    Time  6    Period  Weeks    Status  New    Target Date  09/16/18            Plan - 08/17/18 0907    Clinical Impression Statement  Patient reports that the tape was "awesome" - he noticed less pain in the knee and seems to have less swelling. Took tape off the knee this am. Feels the core is activating more - can feel it working better today than yesterday. Positive response to tape and exercise. Added exercise without difficulty.    Personal Factors and Comorbidities  Past/Current Experience;Comorbidity 1    Examination-Activity Limitations  Carry;Squat;Stairs;Stand;Lift    Examination-Participation Restrictions  Cleaning;Community Activity;Driving    Stability/Clinical Decision Making  Evolving/Moderate complexity    Rehab Potential  Good    PT Frequency  2x / week    PT Duration  6 weeks    PT Treatment/Interventions  ADLs/Self Care Home Management;Cryotherapy;Electrical Stimulation;DME Instruction;Ultrasound;Moist Heat;Iontophoresis 4mg /ml Dexamethasone;Gait training;Stair training;Functional mobility training;Therapeutic activities;Therapeutic exercise;Balance training;Orthotic Fit/Training;Patient/family education;Neuromuscular re-education;Manual techniques;Passive range of motion;Taping;Vasopneumatic Device;Dry needling;Spinal Manipulations;Joint Manipulations    PT Next Visit Plan  review new exercise program; progress with core stabilization; neuromuscular re-education for motor control; functional strengthening; DN/manual work lateral quad as needed    Consulted and Agree with Plan of Care  Patient       Patient will benefit from skilled therapeutic intervention in order to improve the following deficits and impairments:  Abnormal gait, Decreased range of motion, Difficulty walking, Increased muscle spasms, Decreased endurance, Decreased  activity tolerance,  Pain, Decreased balance, Hypomobility, Decreased strength, Decreased mobility  Visit Diagnosis: 1. Acute pain of left knee   2. Muscle weakness (generalized)        Problem List Patient Active Problem List   Diagnosis Date Noted  . Patellar tendinitis, left knee, proximal 07/25/2018  . 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 Benign paroxysmal positional vertigo 07/09/2017  . Mild hyperlipidemia 12/10/2016  . Elevated ALT measurement 12/10/2016  . Benign nevus of skin 11/27/2016  . Essential hypertension 11/27/2016  . Sleep apnea 11/26/2016  . Paresthesia of bilateral legs 08/27/2015  . Family history of cardiac disorder in father 08/27/2015  . Insomnia 08/27/2015    Madgeline Rayo Nilda Simmer, PT, MPH  08/17/2018, 1:31 PM  [facility] [place], [place], [address] Phone: [phone]   Fax:  [phone]  Name: Wesley Pacheco MRN: [id number] Date of Birth: 06/26/1980

## 2018-08-23 ENCOUNTER — Other Ambulatory Visit: Payer: Self-pay

## 2018-08-23 ENCOUNTER — Encounter: Payer: Self-pay | Admitting: Rehabilitative and Restorative Service Providers"

## 2018-08-23 ENCOUNTER — Ambulatory Visit: Payer: 59 | Admitting: Rehabilitative and Restorative Service Providers"

## 2018-08-23 DIAGNOSIS — M25562 Pain in left knee: Secondary | ICD-10-CM | POA: Diagnosis not present

## 2018-08-23 DIAGNOSIS — M6281 Muscle weakness (generalized): Secondary | ICD-10-CM

## 2018-08-23 NOTE — Patient Instructions (Signed)
Standing with equal weight bearing both legs   Standing with small weight shift to left     As that gets easy, tiny knee bend with weight shifted slightly to left    Skiing exercises  Standing shift weight to the left; bend knees; twist hips and knees; twist back to middle; straighten up legs and then shift back to midline   Access Code: [email or web address]  URL: [email or web address]  Date: 08/23/2018  Prepared by: Gillermo Murdoch   Exercises  Single Leg Bridge - 10 reps - 1 sets - 10 sec hold - 2x daily - 7x weekly  Sidelying Hip Adduction - 10 reps - 1-3 sets - 5 sec hold - 1x daily - 7x weekly

## 2018-08-23 NOTE — Therapy (Signed)
[place], [place], [address] Phone: [phone]   Fax:  [phone]  Physical Therapy Treatment  Patient Details  Name: Wesley Pacheco MRN: [id number] Date of Birth: [birth year] Referring Provider (PT): Dr. Dianah Field   Encounter Date: 08/23/2018  PT End of Session - 08/23/18 0808    Visit Number  5    Number of Visits  12    Date for PT Re-Evaluation  09/16/18    PT Start Time  0904    PT Stop Time  0957    PT Time Calculation (min)  53 min    Activity Tolerance  Patient tolerated treatment well       Past Medical History:  Diagnosis Date  . Essential hypertension 11/27/2016  . Family history of cardiac disorder in father 08/27/2015  . Insomnia 08/27/2015  . Sleep apnea 11/26/2016   On CPAP, last sleep study approximately 5-7 years ago     History reviewed. No pertinent surgical history.  There were no vitals filed for this visit.  Subjective Assessment - 08/23/18 0809    Subjective  Patient reports some intermittent pain but no sharp pain. He has been doing some of his exercises. Has noticed some soreness in the lower back but just took it easy over the weekend.    Currently in Pain?  No/denies   at times 2/10   Pain Location  Knee    Pain Orientation  Left    Pain Descriptors / Indicators  Aching    Pain Type  Chronic pain    Pain Onset  More than a month ago    Pain Frequency  Intermittent    Aggravating Factors   bending; crossing leg; standing; walking    Pain Relieving Factors  rest; tape                       [facility] Adult PT Treatment/Exercise - 08/23/18 0001      Neuro Re-ed    Neuro Re-ed Details   neuromuscular re-ed - using vision then tactile cues for posture and alignment working on standing with hip and knee control       Knee/Hip Exercises: Stretches   Passive Hamstring Stretch  Left;2 reps;30 seconds   supine with strap    Quad Stretch  Left;2  reps;30 seconds   prone with strap    Hip Flexor Stretch  Right;Left;4 reps;30 seconds   supine and seated    ITB Stretch  Left;2 reps;30 seconds   supine with strap    ITB Stretch Limitations  trial of ITB stretch in sidelying with PT assist 30-45 sec x 2 reps     Piriformis Stretch  Left;2 reps;30 seconds   supine travell      Knee/Hip Exercises: Aerobic   Stationary Bike  L2 x 6  min      Knee/Hip Exercises: Standing   Other Standing Knee Exercises  working on standing balance and wt bearing equally on bilat LE's       Knee/Hip Exercises: Supine   Quad Sets  Strengthening;Right;10 reps   10 sec hold roll under knee - focus on medial quad    Straight Leg Raise with External Rotation  Strengthening;Right;10 reps   10 sec hold slow eccentric lowering    Patellar Mobs  lateral to medial     Other Supine Knee/Hip Exercises  half bridge Lt LE x 10 reps       Knee/Hip Exercises:  Sidelying   Hip ADduction  Strengthening;Left;10 reps      Manual Therapy   Kinesiotex  Facilitate Muscle;Inhibit Muscle   correction of patella     Kinesiotix   Inhibit Muscle   inhibit lateral quad; inhibit knee hyperextension     Facilitate Muscle   facilitate medial quad              PT Education - 08/23/18 0857    Education Details  HEP    Person(s) Educated  Patient    Methods  Explanation;Demonstration;Tactile cues;Verbal cues;Handout    Comprehension  Verbalized understanding;Returned demonstration;Verbal cues required;Tactile cues required       PT Short Term Goals - 08/05/18 1310      PT SHORT TERM GOAL #1   Title  Pt to be independent with initial HEP    Time  2    Period  Weeks    Status  New    Target Date  08/19/18      PT SHORT TERM GOAL #2   Title  Pt to demo improved ability for quad activation, to perform full LAQ against gravity.    Time  2    Period  Weeks    Status  New    Target Date  08/19/18      PT SHORT TERM GOAL #3   Title  Pt to demo improved knee  flexion AROM to be WNL.    Time  3    Period  Weeks    Status  New    Target Date  08/26/18        PT Long Term Goals - 08/05/18 1312      PT LONG TERM GOAL #1   Title  Pt to be independent with final HEP    Time  6    Period  Weeks    Status  New    Target Date  09/16/18      PT LONG TERM GOAL #2   Title  Pt to report decreased pain in L knee, to 0-2/10 with activity    Time  6    Period  Weeks    Status  New    Target Date  09/16/18      PT LONG TERM GOAL #3   Title  Pt to demo improved strength of L quad and hip, to at least 4+/5 to improve stability and ambulation.    Time  6    Period  Weeks    Status  New    Target Date  09/16/18      PT LONG TERM GOAL #4   Title  Pt to demo ability for squat and lift without pain or deficit, to improve ability for work duties.    Time  6    Period  Weeks    Status  New    Target Date  09/16/18            Plan - 08/23/18 0809    Clinical Impression Statement  Continued improvement with decreased pain - no longer constant and fewer episodes of pain. Patient has decresaed palpable tightness through lateral thigh and patellar areas. Patient demonstrates improving stability through the Lt knee with standing with work in clinic. Patient responded well to neuromuscular work in clinic today.    Personal Factors and Comorbidities  Past/Current Experience;Comorbidity 1    Examination-Activity Limitations  Carry;Squat;Stairs;Stand;Lift    Examination-Participation Restrictions  Cleaning;Community Activity;Driving    Stability/Clinical Decision Making  Evolving/Moderate complexity  Rehab Potential  Good    PT Frequency  2x / week    PT Duration  6 weeks    PT Treatment/Interventions  ADLs/Self Care Home Management;Cryotherapy;Electrical Stimulation;DME Instruction;Ultrasound;Moist Heat;Iontophoresis 4mg /ml Dexamethasone;Gait training;Stair training;Functional mobility training;Therapeutic activities;Therapeutic exercise;Balance  training;Orthotic Fit/Training;Patient/family education;Neuromuscular re-education;Manual techniques;Passive range of motion;Taping;Vasopneumatic Device;Dry needling;Spinal Manipulations;Joint Manipulations    PT Next Visit Plan  review new exercise program; progress with core stabilization; neuromuscular re-education for motor control; functional strengthening; DN/manual work lateral quad as needed    Consulted and Agree with Plan of Care  Patient       Patient will benefit from skilled therapeutic intervention in order to improve the following deficits and impairments:  Abnormal gait, Decreased range of motion, Difficulty walking, Increased muscle spasms, Decreased endurance, Decreased activity tolerance, Pain, Decreased balance, Hypomobility, Decreased strength, Decreased mobility  Visit Diagnosis: 1. Acute pain of left knee   2. Muscle weakness (generalized)        Problem List Patient Active Problem List   Diagnosis Date Noted  . Patellar tendinitis, left knee, proximal 07/25/2018  . 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 Benign paroxysmal positional vertigo 07/09/2017  . Mild hyperlipidemia 12/10/2016  . Elevated ALT measurement 12/10/2016  . Benign nevus of skin 11/27/2016  . Essential hypertension 11/27/2016  . Sleep apnea 11/26/2016  . Paresthesia of bilateral legs 08/27/2015  . Family history of cardiac disorder in father 08/27/2015  . Insomnia 08/27/2015    Vika Buske Nilda Simmer PT, MPH  08/23/2018, 12:45 PM  [facility] [place], [place], [address] Phone: [phone]   Fax:  [phone]  Name: Kamaron Deskins MRN: [id number] Date of Birth: [birth year]

## 2018-08-26 ENCOUNTER — Other Ambulatory Visit: Payer: Self-pay

## 2018-08-26 ENCOUNTER — Encounter: Payer: Self-pay | Admitting: Rehabilitative and Restorative Service Providers"

## 2018-08-26 ENCOUNTER — Ambulatory Visit: Payer: 59 | Admitting: Rehabilitative and Restorative Service Providers"

## 2018-08-26 DIAGNOSIS — M25562 Pain in left knee: Secondary | ICD-10-CM | POA: Diagnosis not present

## 2018-08-26 DIAGNOSIS — M6281 Muscle weakness (generalized): Secondary | ICD-10-CM | POA: Diagnosis not present

## 2018-08-26 NOTE — Therapy (Signed)
[place], [place], [address] Phone: [phone]   Fax:  [phone]  Physical Therapy Treatment  Patient Details  Name: Lonza Shimabukuro MRN: [id number] Date of Birth: [birth year] Referring Provider (PT): Dr. Dianah Field   Encounter Date: 08/26/2018  PT End of Session - 08/26/18 0905    Visit Number  6    Number of Visits  12    Date for PT Re-Evaluation  09/16/18    PT Start Time  0903    PT Stop Time  0954    PT Time Calculation (min)  51 min    Activity Tolerance  Patient tolerated treatment well       Past Medical History:  Diagnosis Date  . Essential hypertension 11/27/2016  . Family history of cardiac disorder in father 08/27/2015  . Insomnia 08/27/2015  . Sleep apnea 11/26/2016   On CPAP, last sleep study approximately 5-7 years ago     History reviewed. No pertinent surgical history.  There were no vitals filed for this visit.  Subjective Assessment - 08/26/18 0906    Subjective  some increase discomfort in the Lt knee - has been working on standing exercises - maybe too much    Currently in Pain?  Yes    Pain Score  4     Pain Location  Knee    Pain Orientation  Left    Pain Descriptors / Indicators  Aching    Pain Type  Chronic pain                       [facility] Adult PT Treatment/Exercise - 08/26/18 0001      Neuro Re-ed    Neuro Re-ed Details   neuromuscular re-ed - for posture and alignment working on standing with hip and knee control       Lumbar Exercises: Supine   Other Supine Lumbar Exercises  trunk rotation bilat holding 20-30 sec to Rt ; 15-20 to Lt x 5 each side       Knee/Hip Exercises: Stretches   Passive Hamstring Stretch  Left;2 reps;30 seconds   supine with strap    Quad Stretch  Left;2 reps;30 seconds   prone with strap    Hip Flexor Stretch  Right;Left;4 reps;30 seconds   supine and seated    ITB Stretch  Left;2 reps;30 seconds   supine  with strap    Piriformis Stretch  Left;2 reps;30 seconds   supine travell      Knee/Hip Exercises: Aerobic   Stationary Bike  L x 5  min      Knee/Hip Exercises: Standing   Other Standing Knee Exercises  working on standing balance and wt bearing equally on bilat LE's     Other Standing Knee Exercises  standing working on wt shift in equal stance and mid stance with release of knee/heel              PT Education - 08/26/18 0948    Education Details  HEP    Person(s) Educated  Patient    Methods  Explanation;Demonstration;Tactile cues;Verbal cues;Handout    Comprehension  Verbalized understanding;Returned demonstration;Verbal cues required;Tactile cues required       PT Short Term Goals - 08/05/18 1310      PT SHORT TERM GOAL #1   Title  Pt to be independent with initial HEP    Time  2    Period  Weeks    Status  New    Target Date  08/19/18      PT SHORT TERM GOAL #2   Title  Pt to demo improved ability for quad activation, to perform full LAQ against gravity.    Time  2    Period  Weeks    Status  New    Target Date  08/19/18      PT SHORT TERM GOAL #3   Title  Pt to demo improved knee flexion AROM to be WNL.    Time  3    Period  Weeks    Status  New    Target Date  08/26/18        PT Long Term Goals - 08/05/18 1312      PT LONG TERM GOAL #1   Title  Pt to be independent with final HEP    Time  6    Period  Weeks    Status  New    Target Date  09/16/18      PT LONG TERM GOAL #2   Title  Pt to report decreased pain in L knee, to 0-2/10 with activity    Time  6    Period  Weeks    Status  New    Target Date  09/16/18      PT LONG TERM GOAL #3   Title  Pt to demo improved strength of L quad and hip, to at least 4+/5 to improve stability and ambulation.    Time  6    Period  Weeks    Status  New    Target Date  09/16/18      PT LONG TERM GOAL #4   Title  Pt to demo ability for squat and lift without pain or deficit, to improve ability for  work duties.    Time  6    Period  Weeks    Status  New    Target Date  09/16/18            Plan - 08/26/18 0906    Clinical Impression Statement  Incresaed neuromuscular tightness/tone. Patient is working oon control of hip and knee with standing and walking. Knee pain is up and down. Overall progressing with the control needed to improve function and decrease pain. Symptoms are in large part neurological in origin.    Personal Factors and Comorbidities  Past/Current Experience;Comorbidity 1    Examination-Activity Limitations  Carry;Squat;Stairs;Stand;Lift    Examination-Participation Restrictions  Cleaning;Community Activity;Driving    Stability/Clinical Decision Making  Evolving/Moderate complexity    Rehab Potential  Good    PT Frequency  2x / week    PT Duration  6 weeks    PT Treatment/Interventions  ADLs/Self Care Home Management;Cryotherapy;Electrical Stimulation;DME Instruction;Ultrasound;Moist Heat;Iontophoresis 4mg /ml Dexamethasone;Gait training;Stair training;Functional mobility training;Therapeutic activities;Therapeutic exercise;Balance training;Orthotic Fit/Training;Patient/family education;Neuromuscular re-education;Manual techniques;Passive range of motion;Taping;Vasopneumatic Device;Dry needling;Spinal Manipulations;Joint Manipulations    PT Next Visit Plan  review new exercise program; progress with core stabilization; neuromuscular re-education for motor control; functional strengthening; DN/manual work lateral quad as needed    Consulted and Agree with Plan of Care  Patient       Patient will benefit from skilled therapeutic intervention in order to improve the following deficits and impairments:  Abnormal gait, Decreased range of motion, Difficulty walking, Increased muscle spasms, Decreased endurance, Decreased activity tolerance, Pain, Decreased balance, Hypomobility, Decreased strength, Decreased mobility  Visit Diagnosis: 1. Acute pain of left knee   2.  Muscle weakness (generalized)  Problem List Patient Active Problem List   Diagnosis Date Noted  . Patellar tendinitis, left knee, proximal 07/25/2018  . 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 Benign paroxysmal positional vertigo 07/09/2017  . Mild hyperlipidemia 12/10/2016  . Elevated ALT measurement 12/10/2016  . Benign nevus of skin 11/27/2016  . Essential hypertension 11/27/2016  . Sleep apnea 11/26/2016  . Paresthesia of bilateral legs 08/27/2015  . Family history of cardiac disorder in father 08/27/2015  . Insomnia 08/27/2015    Leianna Barga Nilda Simmer PT, MPH  08/26/2018, 9:51 AM  [facility] [place], [place], [address] Phone: [phone]   Fax:  [phone]  Name: Ladarian Bonczek MRN: [id number] Date of Birth: Apr 21, 1980

## 2018-08-26 NOTE — Patient Instructions (Signed)
Knee Roll    Lying on back, with knees bent and feet flat on floor, arms outstretched to sides, slowly roll both knees to side, hold 5 seconds. Back to starting position, hold 20 seconds. Then to opposite side, hold 20 seconds. Return to starting position. Keep shoulders and arms in contact with floor.   Standing - good alignment, shift weight slightly to left - weight through heel; lift right heel - pause - return to midline and repeat    Stand evenly - slightly bend knees - pause - return to stand    Stand with one foot slightly ahead of the other - shift hips and weight to forward leg and let back knee bend;  then shift back to back knee

## 2018-08-30 ENCOUNTER — Encounter: Payer: 59 | Admitting: Rehabilitative and Restorative Service Providers"

## 2018-08-30 ENCOUNTER — Other Ambulatory Visit: Payer: Self-pay

## 2018-09-01 ENCOUNTER — Other Ambulatory Visit: Payer: Self-pay

## 2018-09-01 ENCOUNTER — Ambulatory Visit: Payer: 59 | Admitting: Rehabilitative and Restorative Service Providers"

## 2018-09-01 DIAGNOSIS — M6281 Muscle weakness (generalized): Secondary | ICD-10-CM | POA: Diagnosis not present

## 2018-09-01 DIAGNOSIS — M25562 Pain in left knee: Secondary | ICD-10-CM

## 2018-09-01 NOTE — Therapy (Addendum)
[place], [place], [address] Phone: [phone]   Fax:  [phone]  Physical Therapy Treatment  Patient Details  Name: Wesley Pacheco MRN: [id number] Date of Birth: 1980/12/18 Referring Provider (PT): Dr. Dianah Field   Encounter Date: 09/01/2018  PT End of Session - 09/01/18 0911    Visit Number  7    Number of Visits  12    Date for PT Re-Evaluation  09/16/18    PT Start Time  0907    PT Stop Time  0945    PT Time Calculation (min)  38 min    Activity Tolerance  Patient tolerated treatment well       Past Medical History:  Diagnosis Date  . Essential hypertension 11/27/2016  . Family history of cardiac disorder in father 08/27/2015  . Insomnia 08/27/2015  . Sleep apnea 11/26/2016   On CPAP, last sleep study approximately 5-7 years ago     No past surgical history on file.  There were no vitals filed for this visit.  Subjective Assessment - 09/01/18 0908    Subjective  making head way - can feel he is gaining more control in the Lt hip and knee - having less pain in the knee. Just focusing on the walking - keeping hip forward and avoiding locking the knee    Currently in Pain?  No/denies    Pain Score  0-No pain         [facility] PT Assessment - 09/01/18 0001      Assessment   Medical Diagnosis  L Knee pain    Referring Provider (PT)  Dr. Dianah Field      Strength   Overall Strength Comments  L quad: 3/5;  Hamstring: 4+/5,  L hip ext 4+/5;  L hip flex: 4-/5, Abd: 4-/5      Flexibility   Soft Tissue Assessment /Muscle Length  --   improving tissue extensibility Lt LE throughout    Hamstrings  tight Lt > Rt     Quadriceps  tight Lt > Rt     ITB  tight Lt > Rt     Piriformis  tight Lt >> Rt       Ambulation/Gait   Gait Comments  ambulating with improved Lt hip and knee control in standing and with gait                    [facility] Adult PT Treatment/Exercise - 09/01/18  0001      Neuro Re-ed    Neuro Re-ed Details   neuromuscular re-ed - for posture and alignment working on standing with hip and knee control       Lumbar Exercises: Supine   Other Supine Lumbar Exercises  core stabilization ball squeeze btn knees - 8 # wts each hand for shoulder flexion - alternating; together; press up x ~ 10 - 20 each w/ core engaged       Knee/Hip Exercises: Stretches   Passive Hamstring Stretch  Left;2 reps;30 seconds   supine with strap    Quad Stretch  Left;2 reps;30 seconds   prone with strap    Hip Flexor Stretch  Right;Left;3 reps;30 seconds   supine and seated    ITB Stretch  Left;2 reps;30 seconds   supine with strap    Piriformis Stretch  Left;2 reps;30 seconds   supine travell      Knee/Hip Exercises: Aerobic   Elliptical  L1.5 x 5 min focus on  equal LE use       Knee/Hip Exercises: Standing   Other Standing Knee Exercises  working on standing balance and wt bearing equally on bilat LE's     Other Standing Knee Exercises  standing working on wt shift w/ equal shift and trunk rotation with equal movement through hips and trunk      Knee/Hip Exercises: Seated   Sit to Sand  10 reps   froom elevated surface - slow eccentric equal wt bearing               PT Short Term Goals - 08/05/18 1310      PT SHORT TERM GOAL #1   Title  Pt to be independent with initial HEP    Time  2    Period  Weeks    Status  New    Target Date  08/19/18      PT SHORT TERM GOAL #2   Title  Pt to demo improved ability for quad activation, to perform full LAQ against gravity.    Time  2    Period  Weeks    Status  New    Target Date  08/19/18      PT SHORT TERM GOAL #3   Title  Pt to demo improved knee flexion AROM to be WNL.    Time  3    Period  Weeks    Status  New    Target Date  08/26/18        PT Long Term Goals - 08/05/18 1312      PT LONG TERM GOAL #1   Title  Pt to be independent with final HEP    Time  6    Period  Weeks    Status  New     Target Date  09/16/18      PT LONG TERM GOAL #2   Title  Pt to report decreased pain in L knee, to 0-2/10 with activity    Time  6    Period  Weeks    Status  New    Target Date  09/16/18      PT LONG TERM GOAL #3   Title  Pt to demo improved strength of L quad and hip, to at least 4+/5 to improve stability and ambulation.    Time  6    Period  Weeks    Status  New    Target Date  09/16/18      PT LONG TERM GOAL #4   Title  Pt to demo ability for squat and lift without pain or deficit, to improve ability for work duties.    Time  6    Period  Weeks    Status  New    Target Date  09/16/18            Plan - 09/01/18 0911    Clinical Impression Statement  Progressing well with decreased Lt knee pain and improving neuromuscular control of Lt hip and knee - less locking; better knee control    Personal Factors and Comorbidities  Past/Current Experience;Comorbidity 1    Examination-Activity Limitations  Carry;Squat;Stairs;Stand;Lift    Examination-Participation Restrictions  Cleaning;Community Activity;Driving    Stability/Clinical Decision Making  Evolving/Moderate complexity    Rehab Potential  Good    PT Frequency  2x / week    PT Duration  6 weeks    PT Treatment/Interventions  ADLs/Self Care Home Management;Cryotherapy;Electrical Stimulation;DME Instruction;Ultrasound;Moist Heat;Iontophoresis 75m/ml Dexamethasone;Gait training;Stair training;Functional  mobility training;Therapeutic activities;Therapeutic exercise;Balance training;Orthotic Fit/Training;Patient/family education;Neuromuscular re-education;Manual techniques;Passive range of motion;Taping;Vasopneumatic Device;Dry needling;Spinal Manipulations;Joint Manipulations    PT Next Visit Plan  progress with core stabilization; neuromuscular re-education for motor control; functional strengthening; DN/manual work lateral quad as needed    Consulted and Agree with Plan of Care  Patient       Patient will benefit from  skilled therapeutic intervention in order to improve the following deficits and impairments:  Abnormal gait, Decreased range of motion, Difficulty walking, Increased muscle spasms, Decreased endurance, Decreased activity tolerance, Pain, Decreased balance, Hypomobility, Decreased strength, Decreased mobility  Visit Diagnosis: 1. Acute pain of left knee   2. Muscle weakness (generalized)        Problem List Patient Active Problem List   Diagnosis Date Noted  . Patellar tendinitis, left knee, proximal 07/25/2018  . 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 Benign paroxysmal positional vertigo 07/09/2017  . Mild hyperlipidemia 12/10/2016  . Elevated ALT measurement 12/10/2016  . Benign nevus of skin 11/27/2016  . Essential hypertension 11/27/2016  . Sleep apnea 11/26/2016  . Paresthesia of bilateral legs 08/27/2015  . Family history of cardiac disorder in father 08/27/2015  . Insomnia 08/27/2015    Lenzy Kerschner Nilda Simmer PT, MPH  09/01/2018, 9:57 AM  [facility] [place], [place], [address] Phone: [phone]   Fax:  [phone]  Name: Meliton Samad MRN: [id number] Date of Birth: 1980-10-04   PHYSICAL THERAPY DISCHARGE SUMMARY  Visits from Start of Care: 7  Current functional level related to goals / functional outcomes: See progress note for discharge status   Remaining deficits: Continued weakness Lt LE with intermittent pain and fatigue.     Education / Equipment: HEP  Plan: Patient agrees to discharge.  Patient goals were not met. Patient is being discharged due to not returning since the last visit.  ?????     Rashi Granier P. Helene Kelp PT, MPH 10/10/18 1:59 PM

## 2018-09-06 ENCOUNTER — Encounter: Payer: Self-pay | Admitting: Osteopathic Medicine

## 2018-09-06 ENCOUNTER — Encounter: Payer: 59 | Admitting: Rehabilitative and Restorative Service Providers"

## 2018-09-06 ENCOUNTER — Ambulatory Visit (INDEPENDENT_AMBULATORY_CARE_PROVIDER_SITE_OTHER): Payer: 59 | Admitting: Osteopathic Medicine

## 2018-09-06 ENCOUNTER — Other Ambulatory Visit: Payer: Self-pay

## 2018-09-06 VITALS — BP 144/93 | HR 79 | Temp 98.1°F | Wt 335.9 lb

## 2018-09-06 DIAGNOSIS — L247 Irritant contact dermatitis due to plants, except food: Secondary | ICD-10-CM

## 2018-09-06 DIAGNOSIS — R21 Rash and other nonspecific skin eruption: Secondary | ICD-10-CM | POA: Diagnosis not present

## 2018-09-06 MED ORDER — METHYLPREDNISOLONE SODIUM SUCC 125 MG IJ SOLR
125.0000 mg | Freq: Once | INTRAMUSCULAR | Status: AC
  Administered 2018-09-06: 15:00:00 125 mg via INTRAMUSCULAR

## 2018-09-06 MED ORDER — PREDNISONE 10 MG (48) PO TBPK: ORAL_TABLET | Freq: Every day | ORAL | 0 refills | Status: DC

## 2018-09-06 MED ORDER — BETAMETHASONE DIPROPIONATE 0.05 % EX CREA: TOPICAL_CREAM | Freq: Two times a day (BID) | CUTANEOUS | 0 refills | Status: DC

## 2018-09-06 MED ORDER — HYDROXYZINE HCL 50 MG PO TABS: 50.0000 mg | ORAL_TABLET | Freq: Three times a day (TID) | ORAL | 0 refills | Status: DC | PRN

## 2018-09-06 NOTE — Patient Instructions (Signed)
Poison Ivy Dermatitis Poison ivy dermatitis is inflammation of the skin that is caused by chemicals in the leaves of the poison ivy plant. The skin reaction often involves redness, swelling, blisters, and extreme itching. What are the causes? This condition is caused by a chemical (urushiol) found in the sap of the poison ivy plant. This chemical is sticky and can be easily spread to people, animals, and objects. You can get poison ivy dermatitis by:  Having direct contact with a poison ivy plant.  Touching animals, other people, or objects that have come in contact with poison ivy and have the chemical on them. What increases the risk? This condition is more likely to develop in people who:  Are outdoors often in wooded or marshy areas.  Go outdoors without wearing protective clothing, such as closed shoes, long pants, and a long-sleeved shirt. What are the signs or symptoms? Symptoms of this condition include:  Redness of the skin.  Extreme itching.  A rash that often includes bumps and blisters. The rash usually appears 48 hours after exposure, if you have been exposed before. If this is the first time you have been exposed, the rash may not appear until a week after exposure.  Swelling. This may occur if the reaction is more severe. Symptoms usually last for 1-2 weeks. However, the first time you develop this condition, symptoms may last 3-4 weeks. How is this diagnosed? This condition may be diagnosed based on your symptoms and a physical exam. Your health care provider may also ask you about any recent outdoor activity. How is this treated? Treatment for this condition will vary depending on how severe it is. Treatment may include:  Hydrocortisone cream or calamine lotion to relieve itching.  Oatmeal baths to soothe the skin.  Medicines, such as over-the-counter antihistamine tablets.  Oral steroid medicine, for more severe reactions. Follow these instructions at home:  Medicines  Take or apply over-the-counter and prescription medicines only as told by your health care provider.  Use hydrocortisone cream or calamine lotion as needed to soothe the skin and relieve itching. General instructions  Do not scratch or rub your skin.  Apply a cold, wet cloth (cold compress) to the affected areas or take baths in cool water. This will help with itching. Avoid hot baths and showers.  Take oatmeal baths as needed. Use colloidal oatmeal. You can get this at your local pharmacy or grocery store. Follow the instructions on the packaging.  While you have the rash, wash clothes right after you wear them.  Keep all follow-up visits as told by your health care provider. This is important. How is this prevented?   Learn to identify the poison ivy plant and avoid contact with the plant. This plant can be recognized by the number of leaves. Generally, poison ivy has three leaves with flowering branches on a single stem. The leaves are typically glossy, and they have jagged edges that come to a point at the front.  If you have been exposed to poison ivy, thoroughly wash with soap and water right away. You have about 30 minutes to remove the plant resin before it will cause the rash. Be sure to wash under your fingernails, because any plant resin there will continue to spread the rash.  When hiking or camping, wear clothes that will help you to avoid exposure on the skin. This includes long pants, a long-sleeved shirt, tall socks, and hiking boots. You can also apply preventive lotion to your skin to   help limit exposure.  If you suspect that your clothes or outdoor gear came in contact with poison ivy, rinse them off outside with a garden hose before you bring them inside your house.  When doing yard work or gardening, wear gloves, long sleeves, long pants, and boots. Wash your garden tools and gloves if they come in contact with poison ivy.  If you suspect that your pet  has come into contact with poison ivy, wash him or her with pet shampoo and water. Make sure to wear gloves while washing your pet. Contact a health care provider if you have:  Open sores in the rash area.  More redness, swelling, or pain in the affected area.  Redness that spreads beyond the rash area.  Fluid, blood, or pus coming from the affected area.  A fever.  A rash over a large area of your body.  A rash on your eyes, mouth, or genitals.  A rash that does not improve after a few weeks. Get help right away if:  Your face swells or your eyes swell shut.  You have trouble breathing.  You have trouble swallowing. These symptoms may represent a serious problem that is an emergency. Do not wait to see if the symptoms will go away. Get medical help right away. Call your local emergency services (911 in the [place] Do not drive yourself to the hospital. Summary  Poison ivy dermatitis is inflammation of the skin that is caused by chemicals in the leaves of the poison ivy plant.  Symptoms of this condition include redness, itching, a rash, and swelling.  Do not scratch or rub your skin.  Take or apply over-the-counter and prescription medicines only as told by your health care provider. This information is not intended to replace advice given to you by your health care provider. Make sure you discuss any questions you have with your health care provider. Document Released: 01/31/2000 Document Revised: 05/27/2018 Document Reviewed: 01/28/2018 Elsevier Patient Education  2020 [employer] Inc[employer]  

## 2018-09-06 NOTE — Progress Notes (Signed)
HPI: Wesley Pacheco is a 38 y.o. male who  has a past medical history of Essential hypertension (11/27/2016), Family history of cardiac disorder in father (08/27/2015), Insomnia (08/27/2015), and Sleep apnea (11/26/2016).  he presents to [facility] today, 09/06/18,  for chief complaint of:  RASH    Context: was out doing yard work few days ago  Location: widespread, worse on arms/legs and tops of feet   Quality: itchy, blistering lesions   Timing: constant  Modifying factors: son's anti-fungal medication (unsure name) helped some   Assoc signs/symptoms: no fever or joint pain, no headache    At today's visit 09/06/18 ... PMH, PSH, FH reviewed and updated as needed.  Current medication list and allergy/intolerance hx reviewed and updated as needed. (See remainder of HPI, ROS, Phys Exam below)         ASSESSMENT/PLAN: The primary encounter diagnosis was Rash. A diagnosis of Irritant contact dermatitis due to plants, except food was also pertinent to this visit.   No orders of the defined types were placed in this encounter.    Meds ordered this encounter  Medications   methylPREDNISolone sodium succinate (SOLU-MEDROL) 125 mg/2 mL injection 125 mg   hydrOXYzine (ATARAX/VISTARIL) 50 MG tablet    Sig: Take 1 tablet (50 mg total) by mouth 3 (three) times daily as needed for itching.    Dispense:  30 tablet    Refill:  0   betamethasone dipropionate (DIPROLENE) 0.05 % cream    Sig: Apply topically 2 (two) times daily. To affected area(s) as needed    Dispense:  45 g    Refill:  0   predniSONE (STERAPRED UNI-PAK 48 TAB) 10 MG (48) TBPK tablet    Sig: Take by mouth daily. 12-Day taper, po    Dispense:  48 tablet    Refill:  0 Printed in case needed     Patient Instructions  Poison Ivy Dermatitis Poison ivy dermatitis is inflammation of the skin that is caused by chemicals in the leaves of the poison ivy plant. The skin reaction  often involves redness, swelling, blisters, and extreme itching. What are the causes? This condition is caused by a chemical (urushiol) found in the sap of the poison ivy plant. This chemical is sticky and can be easily spread to people, animals, and objects. You can get poison ivy dermatitis by:  Having direct contact with a poison ivy plant.  Touching animals, other people, or objects that have come in contact with poison ivy and have the chemical on them. What increases the risk? This condition is more likely to develop in people who:  Are outdoors often in wooded or [place] areas.  Go outdoors without wearing protective clothing, such as closed shoes, long pants, and a long-sleeved shirt. What are the signs or symptoms? Symptoms of this condition include:  Redness of the skin.  Extreme itching.  A rash that often includes bumps and blisters. The rash usually appears 48 hours after exposure, if you have been exposed before. If this is the first time you have been exposed, the rash may not appear until a week after exposure.  Swelling. This may occur if the reaction is more severe. Symptoms usually last for 1-2 weeks. However, the first time you develop this condition, symptoms may last 3-4 weeks. How is this diagnosed? This condition may be diagnosed based on your symptoms and a physical exam. Your health care provider may also ask you about any recent outdoor activity.  How is this treated? Treatment for this condition will vary depending on how severe it is. Treatment may include:  Hydrocortisone cream or calamine lotion to relieve itching.  Oatmeal baths to soothe the skin.  Medicines, such as over-the-counter antihistamine tablets.  Oral steroid medicine, for more severe reactions. Follow these instructions at home: Medicines  Take or apply over-the-counter and prescription medicines only as told by your health care provider.  Use hydrocortisone cream or calamine lotion  as needed to soothe the skin and relieve itching. General instructions  Do not scratch or rub your skin.  Apply a cold, wet cloth (cold compress) to the affected areas or take baths in cool water. This will help with itching. Avoid hot baths and showers.  Take oatmeal baths as needed. Use colloidal oatmeal. You can get this at your local pharmacy or grocery store. Follow the instructions on the packaging.  While you have the rash, wash clothes right after you wear them.  Keep all follow-up visits as told by your health care provider. This is important. How is this prevented?   Learn to identify the poison ivy plant and avoid contact with the plant. This plant can be recognized by the number of leaves. Generally, poison ivy has three leaves with flowering branches on a single stem. The leaves are typically glossy, and they have jagged edges that come to a point at the front.  If you have been exposed to poison ivy, thoroughly wash with soap and water right away. You have about 30 minutes to remove the plant resin before it will cause the rash. Be sure to wash under your fingernails, because any plant resin there will continue to spread the rash.  When hiking or camping, wear clothes that will help you to avoid exposure on the skin. This includes long pants, a long-sleeved shirt, tall socks, and hiking boots. You can also apply preventive lotion to your skin to help limit exposure.  If you suspect that your clothes or outdoor gear came in contact with poison ivy, rinse them off outside with a garden hose before you bring them inside your house.  When doing yard work or gardening, wear gloves, long sleeves, long pants, and boots. Wash your garden tools and gloves if they come in contact with poison ivy.  If you suspect that your pet has come into contact with poison ivy, wash him or her with pet shampoo and water. Make sure to wear gloves while washing your pet. Contact a health care provider  if you have:  Open sores in the rash area.  More redness, swelling, or pain in the affected area.  Redness that spreads beyond the rash area.  Fluid, blood, or pus coming from the affected area.  A fever.  A rash over a large area of your body.  A rash on your eyes, mouth, or genitals.  A rash that does not improve after a few weeks. Get help right away if:  Your face swells or your eyes swell shut.  You have trouble breathing.  You have trouble swallowing. These symptoms may represent a serious problem that is an emergency. Do not wait to see if the symptoms will go away. Get medical help right away. Call your local emergency services (911 in the [place] Do not drive yourself to the hospital. Summary  Poison ivy dermatitis is inflammation of the skin that is caused by chemicals in the leaves of the poison ivy plant.  Symptoms of this condition include redness,  itching, a rash, and swelling.  Do not scratch or rub your skin.  Take or apply over-the-counter and prescription medicines only as told by your health care provider. This information is not intended to replace advice given to you by your health care provider. Make sure you discuss any questions you have with your health care provider. Document Released: 01/31/2000 Document Revised: 05/27/2018 Document Reviewed: 01/28/2018 Elsevier Patient Education  2020 [employer].      Follow-up plan: Return if symptoms worsen or fail to improve.                                                 ################################################# ################################################# ################################################# #################################################    Current Meds  Medication Sig   Melatonin 1 MG CAPS Take by mouth at bedtime.    Current Facility-Administered Medications for the 09/06/18 encounter (Office Visit) with Emeterio Reeve, DO  Medication   methylPREDNISolone sodium succinate (SOLU-MEDROL) 125 mg/2 mL injection 125 mg    No Known Allergies     Review of Systems:  Constitutional: No recent illness, no fever  HEENT: No  headache  Cardiac: No  chest pain  Respiratory:  No  shortness of breath.   Musculoskeletal: No new myalgia/arthralgia  Skin: +Rash  Neurologic: No  weakness, No  Dizziness   Exam:  BP (!) 144/93 (BP Location: Left Arm, Patient Position: Sitting, Cuff Size: Large)    Pulse 79    Temp 98.1 F (36.7 C) (Oral)    Wt (!) 335 lb 14.4 oz (152.4 kg)    BMI 43.13 kg/m   Constitutional: VS see above. General Appearance: alert, well-developed, well-nourished, NAD  Eyes: Normal lids and conjunctive, non-icteric sclera  Neck: No masses, trachea midline.   Respiratory: Normal respiratory effort. n  Musculoskeletal: Gait normal. Symmetric and independent movement of all extremities  Neurological: Normal balance/coordination. No tremor.  Skin: warm, dry, intact. Blistering rash in scattered spots on arms/legs, worse on dorsum of feet bilaterally   Psychiatric: Normal judgment/insight. Normal mood and affect. Oriented x3.       Visit summary with medication list and pertinent instructions was printed for patient to review, patient was advised to alert [place] if any updates are needed. All questions at time of visit were answered - patient instructed to contact office with any additional concerns. ER/RTC precautions were reviewed with the patient and understanding verbalized.     Please note: voice recognition software was used to produce this document, and typos may escape review. Please contact Dr. Sheppard Coil for any needed clarifications.    Follow up plan: Return if symptoms worsen or fail to improve.

## 2018-09-13 ENCOUNTER — Ambulatory Visit: Payer: 59 | Admitting: Sports Medicine

## 2018-09-21 ENCOUNTER — Other Ambulatory Visit: Payer: Self-pay

## 2018-09-21 ENCOUNTER — Ambulatory Visit (INDEPENDENT_AMBULATORY_CARE_PROVIDER_SITE_OTHER): Payer: 59

## 2018-09-21 ENCOUNTER — Encounter: Payer: Self-pay | Admitting: Sports Medicine

## 2018-09-21 ENCOUNTER — Ambulatory Visit (INDEPENDENT_AMBULATORY_CARE_PROVIDER_SITE_OTHER): Payer: 59 | Admitting: Sports Medicine

## 2018-09-21 DIAGNOSIS — M5417 Radiculopathy, lumbosacral region: Secondary | ICD-10-CM

## 2018-09-21 DIAGNOSIS — M7652 Patellar tendinitis, left knee: Secondary | ICD-10-CM

## 2018-09-21 NOTE — Assessment & Plan Note (Addendum)
Left L4 radiculopathy with chronic left mild foot drop, quad weakness. This is the likely reason for his above patellar tendinitis. He still has some paresthesias to the big toe as well as weakness in his left quad though I think the ship has sailed for surgery we are going to proceed again with conservative treatment in the form of x-rays, MRI, and epidurals. After the MRI he has given [place] the go ahead to proceed with a left L4 epidural, likely L4-L5 transforaminal. He will return to see me 1 month after the injection.  There is a large extraforaminal left L4-L5 disc extrusion causing clear compression of the left extraforaminal L4 nerve root, ordering a left L4-L5 transforaminal epidural.

## 2018-09-21 NOTE — Progress Notes (Addendum)
Subjective:    CC: Follow-up  HPI: Left patellar tendinitis: Now resolved.  Back pain: With known left lumbar radiculopathy, chronic left thigh weakness, left great toe numbness.  It is this abnormal biomechanics that resulted in his left patellar tendinitis, he is having significant pain, chronic weakness, desires additional treatment, no bowel or bladder dysfunction, saddle numbness, or constitutional symptoms.  I reviewed the past medical history, family history, social history, surgical history, and allergies today and no changes were needed.  Please see the problem list section below in epic for further details.  Past Medical History: Past Medical History:  Diagnosis Date  . Essential hypertension 11/27/2016  . Family history of cardiac disorder in father 08/27/2015  . Insomnia 08/27/2015  . Sleep apnea 11/26/2016   On CPAP, last sleep study approximately 5-7 years ago    Past Surgical History: No past surgical history on file. Social History: Social History   Socioeconomic History  . Marital status: Married    Spouse name: Not on file  . Number of children: Not on file  . Years of education: Not on file  . Highest education level: Not on file  Occupational History  . Not on file  Social Needs  . Financial resource strain: Not on file  . Food insecurity    Worry: Not on file    Inability: Not on file  . Transportation needs    Medical: Not on file    Non-medical: Not on file  Tobacco Use  . Smoking status: Never Smoker  . Smokeless tobacco: Never Used  Substance and Sexual Activity  . Alcohol use: Yes    Alcohol/week: 0.0 standard drinks  . Drug use: Not Currently  . Sexual activity: Not Currently  Lifestyle  . Physical activity    Days per week: Not on file    Minutes per session: Not on file  . Stress: Not on file  Relationships  . Social Herbalist on phone: Not on file    Gets together: Not on file    Attends religious service: Not on file     Active member of club or organization: Not on file    Attends meetings of clubs or organizations: Not on file    Relationship status: Not on file  Other Topics Concern  . Not on file  Social History Narrative  . Not on file   Family History: Family History  Problem Relation Age of Onset  . Cancer Mother   . Hypertension Father   . Heart attack Father    Allergies: No Known Allergies Medications: See med rec.  Review of Systems: No fevers, chills, night sweats, weight loss, chest pain, or shortness of breath.   Objective:    General: Well Developed, well nourished, and in no acute distress.  Neuro: Alert and oriented x3, extra-ocular muscles intact, sensation grossly intact.  HEENT: Normocephalic, atraumatic, pupils equal round reactive to light, neck supple, no masses, no lymphadenopathy, thyroid nonpalpable.  Skin: Warm and dry, no rashes. Cardiac: Regular rate and rhythm, no murmurs rubs or gallops, no lower extremity edema.  Respiratory: Clear to auscultation bilaterally. Not using accessory muscles, speaking in full sentences. Back Exam:  Inspection: Unremarkable  Motion: Flexion 45 deg, Extension 45 deg, Side Bending to 45 deg bilaterally,  Rotation to 45 deg bilaterally  SLR laying: Negative  XSLR laying: Negative  Palpable tenderness: None. FABER: negative. Sensory change: Gross sensation intact to all lumbar and sacral dermatomes.  Reflexes: 2+ at  both patellar tendons, 2+ at achilles tendons, Babinski's downgoing.  Strength at foot  Plantar-flexion: 5/5 Dorsi-flexion: 4/5 Eversion: 5/5 Inversion: 5/5  Leg strength  Quad: 5/5 Hamstring: 5/5 Hip flexor: 5/5 Hip abductors: 5/5  Gait unremarkable.  Impression and Recommendations:    Patellar tendinitis, left knee, proximal MRI did show multiple pathologic changes including osteoarthritis, prepatellar bursitis, proximal patellar tendinosis and distal quadriceps tendinitis. He does have a history of a left L4  radiculopathy after a chronic herniated disc. Patellar tendinitis pain has resolved with topical nitroglycerin, formal physical therapy. Ultimately we do need to correct his biomechanics to prevent recurrence.  Left lumbosacral radiculopathy at L4 Left L4 radiculopathy with chronic left mild foot drop, quad weakness. This is the likely reason for his above patellar tendinitis. He still has some paresthesias to the big toe as well as weakness in his left quad though I think the ship has sailed for surgery we are going to proceed again with conservative treatment in the form of x-rays, MRI, and epidurals. After the MRI he has given [place] the go ahead to proceed with a left L4 epidural, likely L4-L5 transforaminal. He will return to see me 1 month after the injection.  There is a large extraforaminal left L4-L5 disc extrusion causing clear compression of the left extraforaminal L4 nerve root, ordering a left L4-L5 transforaminal epidural.    ___________________________________________ Gwen Her. Dianah Field, M.D., ABFM., CAQSM. Primary Care and Sports Medicine [facility]  Adjunct Professor of [employer]

## 2018-09-21 NOTE — Assessment & Plan Note (Signed)
MRI did show multiple pathologic changes including osteoarthritis, prepatellar bursitis, proximal patellar tendinosis and distal quadriceps tendinitis. He does have a history of a left L4 radiculopathy after a chronic herniated disc. Patellar tendinitis pain has resolved with topical nitroglycerin, formal physical therapy. Ultimately we do need to correct his biomechanics to prevent recurrence.

## 2018-09-28 ENCOUNTER — Telehealth: Payer: Self-pay

## 2018-09-28 NOTE — Telephone Encounter (Signed)
Apolonio Schneiders, it is telling me the number you gave me for a peer to peer is invalid, please look into this and let me have a corrected number, I am happy to do a peer to peer.

## 2018-09-28 NOTE — Telephone Encounter (Signed)
[employer] advised me that patient's prior auth. Submitted on 09/23/18 for Lumbar MRI was denied. States they needed additional information as well as treatments and re-evaluations etc.   I asked to speak with nurse to do this over phone but was advised that it has to be either faxed in or peer-to-peer must be scheduled.   I have faxed additional information and notes to be reconsidered, will keep you updated if peer-to-peer needs to be done.   Reconsideration fax: [phone] Case #: [id number] Peer-to-peer: [phone] option 1  FYI to ordering provider

## 2018-09-29 NOTE — Telephone Encounter (Signed)
MRI approved, approval number [id number] approval is good until November 13, 2018

## 2018-09-29 NOTE — Telephone Encounter (Signed)
I'm the worst.. you're the best. Thanks!

## 2018-10-03 ENCOUNTER — Ambulatory Visit (INDEPENDENT_AMBULATORY_CARE_PROVIDER_SITE_OTHER): Payer: 59

## 2018-10-03 ENCOUNTER — Other Ambulatory Visit: Payer: Self-pay

## 2018-10-03 DIAGNOSIS — M5417 Radiculopathy, lumbosacral region: Secondary | ICD-10-CM

## 2018-10-04 NOTE — Addendum Note (Signed)
Addended by: Silverio Decamp on: 10/04/2018 09:16 AM   Modules accepted: Orders

## 2018-10-19 ENCOUNTER — Other Ambulatory Visit: Payer: Self-pay

## 2018-10-19 ENCOUNTER — Encounter: Payer: Self-pay | Admitting: Sports Medicine

## 2018-10-19 ENCOUNTER — Ambulatory Visit (INDEPENDENT_AMBULATORY_CARE_PROVIDER_SITE_OTHER): Payer: 59 | Admitting: Sports Medicine

## 2018-10-19 DIAGNOSIS — M5417 Radiculopathy, lumbosacral region: Secondary | ICD-10-CM

## 2018-10-19 MED ORDER — HYDROCODONE-ACETAMINOPHEN 10-325 MG PO TABS: 0.5000 | ORAL_TABLET | Freq: Three times a day (TID) | ORAL | 0 refills | Status: DC | PRN

## 2018-10-19 NOTE — Assessment & Plan Note (Signed)
Left L4 radiculopathy with chronic left mild foot drop, quad weakness. This is the likely reason for his above patellar tendinitis. He still has some paresthesias to the big toe as well as weakness in his left quad though I think the ship has sailed for surgery we are going to proceed again with conservative treatment in the form of x-rays, MRI, and epidurals. After the MRI he has given [place] the go ahead to proceed with a left L4 epidural, likely L4-L5 transforaminal. He will return to see me 1 month after the injection.  There is a large extraforaminal left L4-L5 disc extrusion causing clear compression of the left extraforaminal L4 nerve root, ordering a left L4-L5 transforaminal epidural.   Greylin is still not yet had his shot, I am going to add some hydrocodone to hold him over until he gets the injection.

## 2018-10-19 NOTE — Progress Notes (Signed)
Subjective:    CC: Follow-up  HPI: Lumbosacral radiculopathy: Left L4, has not yet had injection.  I reviewed the past medical history, family history, social history, surgical history, and allergies today and no changes were needed.  Please see the problem list section below in epic for further details.  Past Medical History: Past Medical History:  Diagnosis Date  . Essential hypertension 11/27/2016  . Family history of cardiac disorder in father 08/27/2015  . Insomnia 08/27/2015  . Sleep apnea 11/26/2016   On CPAP, last sleep study approximately 5-7 years ago    Past Surgical History: No past surgical history on file. Social History: Social History   Socioeconomic History  . Marital status: Married    Spouse name: Not on file  . Number of children: Not on file  . Years of education: Not on file  . Highest education level: Not on file  Occupational History  . Not on file  Social Needs  . Financial resource strain: Not on file  . Food insecurity    Worry: Not on file    Inability: Not on file  . Transportation needs    Medical: Not on file    Non-medical: Not on file  Tobacco Use  . Smoking status: Never Smoker  . Smokeless tobacco: Never Used  Substance and Sexual Activity  . Alcohol use: Yes    Alcohol/week: 0.0 standard drinks  . Drug use: Not Currently  . Sexual activity: Not Currently  Lifestyle  . Physical activity    Days per week: Not on file    Minutes per session: Not on file  . Stress: Not on file  Relationships  . Social Herbalist on phone: Not on file    Gets together: Not on file    Attends religious service: Not on file    Active member of club or organization: Not on file    Attends meetings of clubs or organizations: Not on file    Relationship status: Not on file  Other Topics Concern  . Not on file  Social History Narrative  . Not on file   Family History: Family History  Problem Relation Age of Onset  . Cancer Mother    . Hypertension Father   . Heart attack Father    Allergies: No Known Allergies Medications: See med rec.  Review of Systems: No fevers, chills, night sweats, weight loss, chest pain, or shortness of breath.   Objective:    General: Well Developed, well nourished, and in no acute distress.  Neuro: Alert and oriented x3, extra-ocular muscles intact, sensation grossly intact.  HEENT: Normocephalic, atraumatic, pupils equal round reactive to light, neck supple, no masses, no lymphadenopathy, thyroid nonpalpable.  Skin: Warm and dry, no rashes. Cardiac: Regular rate and rhythm, no murmurs rubs or gallops, no lower extremity edema.  Respiratory: Clear to auscultation bilaterally. Not using accessory muscles, speaking in full sentences.  Impression and Recommendations:    Left lumbosacral radiculopathy at L4 Left L4 radiculopathy with chronic left mild foot drop, quad weakness. This is the likely reason for his above patellar tendinitis. He still has some paresthesias to the big toe as well as weakness in his left quad though I think the ship has sailed for surgery we are going to proceed again with conservative treatment in the form of x-rays, MRI, and epidurals. After the MRI he has given [place] the go ahead to proceed with a left L4 epidural, likely L4-L5 transforaminal. He will return  to see me 1 month after the injection.  There is a large extraforaminal left L4-L5 disc extrusion causing clear compression of the left extraforaminal L4 nerve root, ordering a left L4-L5 transforaminal epidural.   Wesley Pacheco is still not yet had his shot, I am going to add some hydrocodone to hold him over until he gets the injection.   ___________________________________________ Gwen Her. Dianah Field, M.D., ABFM., CAQSM. Primary Care and Sports Medicine [facility]  Adjunct Professor of [employer] [facility] [employer] [employer]

## 2018-11-07 ENCOUNTER — Other Ambulatory Visit: Payer: Self-pay

## 2018-11-07 ENCOUNTER — Ambulatory Visit
Admission: RE | Admit: 2018-11-07 | Discharge: 2018-11-07 | Disposition: A | Discharge status: Home / Self Care | Payer: 59 | Source: Ambulatory Visit | Attending: Sports Medicine | Admitting: Sports Medicine

## 2018-11-07 MED ORDER — METHYLPREDNISOLONE ACETATE 40 MG/ML INJ SUSP (RADIOLOG: 120.0000 mg | Freq: Once | INTRAMUSCULAR | Status: DC

## 2018-11-07 MED ORDER — IOPAMIDOL (ISOVUE-M 200) INJECTION 41%: 1.0000 mL | Freq: Once | INTRAMUSCULAR | Status: DC

## 2018-11-11 ENCOUNTER — Other Ambulatory Visit: Payer: Self-pay | Admitting: *Deleted

## 2018-11-11 DIAGNOSIS — M5417 Radiculopathy, lumbosacral region: Secondary | ICD-10-CM

## 2018-11-11 MED ORDER — HYDROCODONE-ACETAMINOPHEN 10-325 MG PO TABS: 0.5000 | ORAL_TABLET | Freq: Three times a day (TID) | ORAL | 0 refills | Status: DC | PRN

## 2018-11-29 ENCOUNTER — Ambulatory Visit (INDEPENDENT_AMBULATORY_CARE_PROVIDER_SITE_OTHER): Payer: 59 | Admitting: Physician Assistant

## 2018-11-29 ENCOUNTER — Encounter: Payer: Self-pay | Admitting: Physician Assistant

## 2018-11-29 ENCOUNTER — Other Ambulatory Visit: Payer: Self-pay

## 2018-11-29 VITALS — BP 152/98 | HR 72 | Ht 74.0 in | Wt 336.0 lb

## 2018-11-29 DIAGNOSIS — H00036 Abscess of eyelid left eye, unspecified eyelid: Secondary | ICD-10-CM | POA: Diagnosis not present

## 2018-11-29 DIAGNOSIS — H1013 Acute atopic conjunctivitis, bilateral: Secondary | ICD-10-CM | POA: Diagnosis not present

## 2018-11-29 DIAGNOSIS — I10 Essential (primary) hypertension: Secondary | ICD-10-CM | POA: Diagnosis not present

## 2018-11-29 MED ORDER — DOXYCYCLINE HYCLATE 100 MG PO TABS: 100.0000 mg | ORAL_TABLET | Freq: Two times a day (BID) | ORAL | 0 refills | Status: DC

## 2018-11-29 MED ORDER — AZELASTINE HCL 0.05 % OP SOLN: 1.0000 [drp] | Freq: Two times a day (BID) | OPHTHALMIC | 5 refills | Status: DC

## 2018-11-29 NOTE — Progress Notes (Signed)
Subjective:    Patient ID: Wesley Pacheco, male    DOB: May 10, 1980, 38 y.o.   MRN: [id number]  HPI  Pt is a 38 yo male with HTN, Sleep apnea who presents to the clinic with left upper eyelid swollen/tender/red and now noticing discharge coming from left eye this morning. He was mowing the lawn this weekend and a tree branch shot up and hit him in the left eye. No vision changes. No real pain. He tends to get some itchy eyes after mowing. Both eyes felt scratchy. Left eye is more tender. Noticed worse this am and some crusty discharge this morning. No vision changes or loss. Does not hurt in eye only over eyelid. Eyes are itchy too. Not tried anything to make better. No fever, chills, body aches.   Pt admits he does not check BP. He has not toleated BP medications in the past. Pt denies any CP, SOB, Headaches.   Active Ambulatory Problems    Diagnosis Date Noted  . Paresthesia of bilateral legs 08/27/2015  . Family history of cardiac disorder in father 08/27/2015  . Insomnia 08/27/2015  . Sleep apnea 11/26/2016  . Benign nevus of skin 11/27/2016  . Essential hypertension 11/27/2016  . Mild hyperlipidemia 12/10/2016  . Elevated ALT measurement 12/10/2016  . 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 Benign paroxysmal positional vertigo 07/09/2017  . Patellar tendinitis, left knee, proximal 07/25/2018  . Left lumbosacral radiculopathy at L4 09/21/2018   Resolved Ambulatory Problems    Diagnosis Date Noted  . No Resolved Ambulatory Problems   No Additional Past Medical History     Review of Systems See HPI.     Objective:   Physical Exam Vitals signs reviewed.  Constitutional:      Appearance: Normal appearance.  HENT:     Head: Normocephalic.     Right Ear: Tympanic membrane normal.     Left Ear: Tympanic membrane normal.     Nose: Nose normal. No congestion or rhinorrhea.     Mouth/Throat:     Mouth: Mucous membranes are moist.  Eyes:     Extraocular Movements:  Extraocular movements intact.     Pupils: Pupils are equal, round, and reactive to light.     Comments: Bilateral conjunctiva injected. No active discharge but do appear watery.  Left upper eyelid swollen, warm, tender to palpation.   Cardiovascular:     Rate and Rhythm: Normal rate and regular rhythm.  Pulmonary:     Effort: Pulmonary effort is normal.  Neurological:     General: No focal deficit present.     Mental Status: He is alert and oriented to person, place, and time.  Psychiatric:        Mood and Affect: Mood normal.           Assessment & Plan:  Marland KitchenMarland KitchenJarques was seen today for eye problem.  Diagnoses and all orders for this visit:  Eyelid cellulitis, left -     doxycycline (VIBRA-TABS) 100 MG tablet; Take 1 tablet (100 mg total) by mouth 2 (two) times daily.  Allergic conjunctivitis of both eyes -     azelastine (OPTIVAR) 0.05 % ophthalmic solution; Place 1 drop into both eyes 2 (two) times daily.  Essential hypertension   Left eyelid is red and swollen and warm to touch. A tree branch did hit it.  No vision changes. Will treat for cellulitis with doxycycline. Encouraged warm compresses.   optivar given for allergic conjunctivitis. Can use before he mows  the lawn.   Discussed elevated BP. He reports he has tried BP medication and did not like the side effects. I encouraged him to talk to PcP might be better options. Work on lowering sodium in diet. Avoid alcohol. Continue to monitor BP at home.   Follow up as needed or if symptoms worsen.

## 2018-11-29 NOTE — Patient Instructions (Signed)
Blepharitis Blepharitis is swelling of the eyelids. Symptoms may include:  Reddish, scaly skin around the scalp and eyebrows.  Burning or itching of the eyelids.  Fluid coming from the eye at night. This causes the eyelashes to stick together in the morning.  Eyelashes that fall out.  Being sensitive to light. Follow these instructions at home: Pay attention to any changes in how you look or feel. Tell your health care provider about any changes. Follow these instructions to help with your condition: Keeping clean   Wash your hands often.  Wash your eyelids with warm water, or wash them with warm water that is mixed with little bit of baby shampoo. Do this 2 or more times per day.  Wash your face and eyebrows at least once a day.  Use a clean towel each time you dry your eyelids. Do not use the towel to clean or dry other areas of your body. Do not share your towel with anyone. General instructions  Avoid wearing makeup until you get better. Do not share makeup with anyone.  Avoid rubbing your eyes.  Put a warm compress on your eyes 2 times per day for 10 minutes at a time, or as told by your doctor.  If you were given antibiotics in the form of creams or eye drops, use the medicine as told by your doctor. Do not stop using the medicine even if you feel better.  Keep all follow-up visits as told by your doctor. This is important. Contact a doctor if:  Your eyelids feel hot.  You have blisters on your eyelids.  You have a rash on your eyelids.  The swelling does not go away in 2-4 days.  The swelling gets worse. Get help right away if:  You have pain that gets worse.  You have pain that spreads to other parts of your face.  You have redness that gets worse.  You have redness that spreads to other parts of your face.  Your vision changes.  You have pain when you look at lights or things that move.  You have a fever. Summary  Blepharitis is swelling of the  eyelids.  Pay attention to any changes in how your eyes look or feel. Tell your doctor about any changes.  Follow home care instructions as told by your doctor. Wash your hands often. Avoid wearing makeup. Do not rub your eyes.  Use warm compresses, creams, or eye drops as told by your doctor.  Let your doctor know if you have changes in vision, blisters or rash on eyelids, pain that spreads to your face, or warmth on your eyelids. This information is not intended to replace advice given to you by your health care provider. Make sure you discuss any questions you have with your health care provider. Document Released: 11/12/2007 Document Revised: 08/02/2017 Document Reviewed: 08/02/2017 Elsevier Patient Education  2020 [employer].

## 2018-12-01 ENCOUNTER — Encounter: Payer: Self-pay | Admitting: Family Medicine

## 2018-12-01 ENCOUNTER — Other Ambulatory Visit: Payer: Self-pay

## 2018-12-01 ENCOUNTER — Ambulatory Visit (INDEPENDENT_AMBULATORY_CARE_PROVIDER_SITE_OTHER): Payer: 59 | Admitting: Family Medicine

## 2018-12-01 VITALS — BP 156/95 | HR 60 | Temp 97.7°F | Wt 336.0 lb

## 2018-12-01 DIAGNOSIS — L03213 Periorbital cellulitis: Secondary | ICD-10-CM | POA: Diagnosis not present

## 2018-12-01 MED ORDER — CEFDINIR 300 MG PO CAPS: 300.0000 mg | ORAL_CAPSULE | Freq: Two times a day (BID) | ORAL | 0 refills | Status: DC

## 2018-12-01 MED ORDER — POLYMYXIN B-TRIMETHOPRIM 10000-0.1 UNIT/ML-% OP SOLN: 2.0000 [drp] | Freq: Four times a day (QID) | OPHTHALMIC | 0 refills | Status: DC

## 2018-12-01 NOTE — Patient Instructions (Signed)
Thank you for coming in today. I think you have a bacterial infection of the eyelid.  Continue doxycyline Add Omnicef antibiotic twice daily for 1 week.  Use the antibiotic eye drops as well.  If not improving or worsening let me know.  Next step is referral to eye doctor.  [facility] Address: [address], [place], [place] [address] Phone: [phone]   Blepharitis Blepharitis is inflammation of the eyelids. Blepharitis may happen with:  Reddish, scaly skin around the scalp and eyebrows.  Burning or itching of the eyelids.  Eye discharge at night that causes the eyelashes to stick together in the morning.  Eyelashes that fall out.  Sensitivity to light. Follow these instructions at home: Pay attention to any changes in how your eyes look or feel. Tell your health care provider about any changes. Follow these instructions to help with your condition. Keeping Coca-Cola your hands often.  Wash your eyelids with warm water or with warm water that is mixed with a small amount of baby shampoo. Do this two times per day or as often as needed.  Wash your face and eyebrows at least once a day.  Use a clean towel each time you dry your eyelids. Do not use this towel to clean or dry other areas of your body. Do not share your towel with anyone. General instructions  Avoid wearing makeup until you get better. Do not share makeup with anyone.  Avoid rubbing your eyes.  Apply warm compresses to your eyes 2 times per day for 10 minutes at a time, or as told by your health care provider.  If you were prescribed an antibiotic ointment or steroid drops, apply or use the medicine as told by your health care provider. Do not stop using the medicine even if you feel better.  Keep all follow-up visits as told by your health care provider. This is important. Contact a health care provider if:  Your eyelids feel hot.  You have blisters or a rash on your eyelids.   The condition does not go away in 2-4 days.  The inflammation gets worse. Get help right away if:  You have pain or redness that gets worse or spreads to other parts of your face.  Your vision changes.  You have pain when looking at lights or moving objects.  You have a fever. Summary  Blepharitis is inflammation of the eyelids.  Pay attention to any changes in how your eyes look or feel. Tell your health care provider about any changes.  Follow home care instructions as told by your health care provider. Wash your hands often. Avoid wearing makeup. Do not rub your eyes.  To treat this condition, use warm compresses and prescription ointments or eye drops.  Let your health care provider know if you have vision changes, blisters or rash on eyelids, pain that spreads to your face, or warmth on your eyelids. This information is not intended to replace advice given to you by your health care provider. Make sure you discuss any questions you have with your health care provider. Document Released: 01/31/2000 Document Revised: 07/26/2017 Document Reviewed: 07/26/2017 Elsevier Patient Education  [place] Cellulitis, Adult  Preseptal cellulitis is an infection of the eyelid and the tissues around the eye (periorbital area). The infection causes painful swelling and redness. This condition may also be called periorbital cellulitis. In most cases, the condition can be treated with antibiotic  medicine at home. It is important to treat preseptal cellulitis right away so that it does not get worse. If it gets worse, it can spread to the eye socket and eye muscles (orbital cellulitis). Orbital cellulitis is a medical emergency. What are the causes? Preseptal cellulitis is most commonly caused by bacteria. In rare cases, it can be caused by a virus or fungus. The germs that cause preseptal cellulitis may come from:  A sinus infection that spreads near the eyes.  An  injury near the eye, such as a scratch, animal bite, or insect bite.  A skin rash that becomes infected, such as eczema or poison ivy.  An infected pimple on the eyelid (stye).  Infection after eyelid surgery or injury. What increases the risk? You are more likely to develop this condition if:  You have a weakened disease-fighting system (immune system).  You have a medical condition that raises your risk for sinus infections, such as nasal polyps. What are the signs or symptoms? Symptoms of this condition usually develop suddenly. Symptoms may include:  Eyelids that are red and swollen and feel unusually hot.  Fever.  Difficulty opening the eye.  Headache.  Facial pain. How is this diagnosed? This condition may be diagnosed based on your symptoms, your medical history, and an eye exam. You may have tests, such as:  Blood tests.  CT scan.  MRI. How is this treated? This condition is treated with antibiotic medicines. These may be given by mouth (orally), through an IV, or as a shot. In rare cases, you may need surgery to drain an infected area. Follow these instructions at home: Medicines  If you were prescribed an antibiotic to take at home, take it as told by your health care provider. Do not stop taking the antibiotic even if you start to feel better.  Take over-the-counter and prescription medicines only as told by your health care provider. Eye Care  Do not use eye drops without first getting approval from your health care provider.  Do not touch or rub your eye. If you wear contact lenses, do not wear them until your health care provider approves.  Keep the eye area clean and dry.  Wash the eye area with a clean washcloth, warm water, and baby shampoo or mild soap.  To help relieve discomfort, place a clean washcloth that is wet with warm water over your eye. Leave the washcloth on for a few minutes, then remove it. General instructions  Wash your hands  with soap and water often. If soap and water are not available, use hand sanitizer.  Do not use any products that contain nicotine or tobacco, such as cigarettes and e-cigarettes. If you need help quitting, ask your health care provider.  Drink enough fluid to keep your urine pale yellow.  Ask your health care provider if it is safe for you to drive.  Stay up to date on your vaccinations.  Keep all follow-up visits as told by your health care provider. This includes any visits with an eye specialist (ophthalmologist) or dentist. This is important. Get help right away if:  You have new symptoms.  Your symptoms get worse or do not get better with treatment.  You have a fever.  Your vision becomes blurry or gets worse in any way.  Your eye looks like it is sticking out or bulging out (proptosis).  You have trouble moving your eyes.  You have a severe headache.  You have neck stiffness or severe  neck pain. Summary  Preseptal cellulitis is an infection of the eyelid and the tissues around the eye.  Symptoms of preseptal cellulitis usually develop suddenly and include red and swollen eyelids, fever, difficulty opening the eye, headache, and facial pain.  This condition is treated with antibiotic medicines. Do not stop taking the antibiotic even if you start to feel better. This information is not intended to replace advice given to you by your health care provider. Make sure you discuss any questions you have with your health care provider. Document Released: 03/07/2010 Document Revised: 01/15/2017 Document Reviewed: 11/25/2016 Elsevier Patient Education  2020 [employer].

## 2018-12-01 NOTE — Progress Notes (Signed)
Erasmus Gudiel is a 38 y.o. male who presents to [facility]: [place] today for left eyelid swelling.  Started about a week ago Carrol developed some tingling in his left eyelid.  On Sunday, October 11 Cle[place] mowed the lawn and thinks maybe he got some grass clippings in his diet but is not sure.  The swelling worsened and he was seen on Tuesday the 13th by my partner Iran Planas PA.  He was thought to have hordeolum or possibly cellulitis of the eyelid and was treated empirically with doxycycline and antihistamine eyedrops.  He notes this has not helped much and he continues to have left upper eyelid swelling with some discomfort.  He denies significant blurry vision.  He notes that he is had a little bit of ear pain as well with this.  He denies any rash.  He feels well otherwise.    ROS as above:  Exam:  BP (!) 156/95   Pulse 60   Temp 97.7 F (36.5 C) (Oral)   Wt (!) 336 lb (152.4 kg)   BMI 43.14 kg/m  Wt Readings from Last 5 Encounters:  12/01/18 (!) 336 lb (152.4 kg)  11/29/18 (!) 336 lb (152.4 kg)  10/19/18 (!) 334 lb (151.5 kg)  09/21/18 (!) 335 lb (152 kg)  09/06/18 (!) 335 lb 14.4 oz (152.4 kg)    Gen: Well NAD HEENT: EOMI,  MMM left upper eyelid swelling and erythema.  No conjunctival injection.  Normal eye motion without pain.  No diplopia.  Left ear canal tympanic membrane and external ear are normal-appearing Lungs: Normal work of breathing. CTABL Heart: RRR no MRG Abd: NABS, Soft. Nondistended, Nontender Exts: Brisk capillary refill, warm and well perfused.  Skin: No rash visible on face.        Assessment and Plan: 38 y.o. male with left eyelid swelling and pain consistent with preseptal cellulitis.  No evidence of orbital cellulitis.  Differential includes blepharitis as well.  Plan to broaden antibiotic coverage with oral Omnicef.  With  doxycycline this should cover most bacterial pathogens.  Additionally will use Polytrim eyedrops.  If not improving patient will let us know.  Next step would likely be referral to ophthalmology.  Provided contact information to [facility] eye surgeons here in town.  He can certainly try calling as well on his own if he would like.  Precautions reviewed handout provided.  PDMP not reviewed this encounter. No orders of the defined types were placed in this encounter.  Meds ordered this encounter  Medications  . cefdinir (OMNICEF) 300 MG capsule    Sig: Take 1 capsule (300 mg total) by mouth 2 (two) times daily.    Dispense:  14 capsule    Refill:  0  . trimethoprim-polymyxin b (POLYTRIM) ophthalmic solution    Sig: Place 2 drops into the left eye every 6 (six) hours.    Dispense:  10 mL    Refill:  0     Historical information moved to improve visibility of documentation.  Past Medical History:  Diagnosis Date  . Essential hypertension 11/27/2016  . Family history of cardiac disorder in father 08/27/2015  . Insomnia 08/27/2015  . Sleep apnea 11/26/2016   On CPAP, last sleep study approximately 5-7 years ago    No past surgical history on file. Social History   Tobacco Use  . Smoking status: Never Smoker  . Smokeless tobacco: Never Used  Substance Use Topics  .  Alcohol use: Yes    Alcohol/week: 0.0 standard drinks   family history includes Cancer in his mother; Heart attack in his father; Hypertension in his father.  Medications: Current Outpatient Medications  Medication Sig Dispense Refill  . doxycycline (VIBRA-TABS) 100 MG tablet Take 1 tablet (100 mg total) by mouth 2 (two) times daily. 20 tablet 0  . HYDROcodone-acetaminophen (NORCO) 10-325 MG tablet Take 0.5-1 tablets by mouth every 8 (eight) hours as needed. 30 tablet 0  . ibuprofen (ADVIL,MOTRIN) 600 MG tablet Take 600 mg by mouth every 6 (six) hours as needed.    . Melatonin 1 MG CAPS Take by mouth at bedtime.      . cefdinir (OMNICEF) 300 MG capsule Take 1 capsule (300 mg total) by mouth 2 (two) times daily. 14 capsule 0  . trimethoprim-polymyxin b (POLYTRIM) ophthalmic solution Place 2 drops into the left eye every 6 (six) hours. 10 mL 0   No current facility-administered medications for this visit.    No Known Allergies   Discussed warning signs or symptoms. Please see discharge instructions. Patient expresses understanding.

## 2019-01-10 ENCOUNTER — Telehealth: Payer: Self-pay

## 2019-01-10 NOTE — Telephone Encounter (Signed)
Wesley Pacheco's wife called and states he has had elevated blood pressure for a couple of days. Now he has blurred vision, per his wife. She could not answer question about shortness of breath or chest pain. His blood pressure was elevated during the last few visits. I tried to call patient and he didn't answer. I advise wife to have him go to the ED to be evaluated. His dad died of a heart attack in his 94's.

## 2019-01-10 NOTE — Telephone Encounter (Signed)
Would agree needs urgent care eval at least to verify BP, without much other detail I can't be too helpful!

## 2019-01-16 ENCOUNTER — Ambulatory Visit (INDEPENDENT_AMBULATORY_CARE_PROVIDER_SITE_OTHER): Payer: 59 | Admitting: Osteopathic Medicine

## 2019-01-16 ENCOUNTER — Encounter: Payer: Self-pay | Admitting: Osteopathic Medicine

## 2019-01-16 VITALS — Wt 331.0 lb

## 2019-01-16 DIAGNOSIS — I10 Essential (primary) hypertension: Secondary | ICD-10-CM

## 2019-01-16 NOTE — Progress Notes (Signed)
Virtual Visit via Video (App used: DOximity) Note  I connected with      Wesley Pacheco on 01/16/19 at@ by a telemedicine application and verified that I am speaking with the correct person using two identifiers.  Patient is at home I am in office   I discussed the limitations of evaluation and management by telemedicine and the availability of in person appointments. The patient expressed understanding and agreed to proceed.  History of Present Illness: Wesley Pacheco is a 38 y.o. male who would like to discuss eye, BP  Eye swelling is much improved but not totally back to normal, no pain. Recent treatment for preseptal cellulitis    HTN: Needs to get home BP machine  Lisinopril has made him tired but otherwise feels ok\ BP Readings from Last 3 Encounters:  12/01/18 (!) 156/95  11/29/18 (!) 152/98  11/07/18 (!) 155/98        Observations/Objective: Wt (!) 331 lb (150.1 kg)   BMI 42.50 kg/m  BP Readings from Last 3 Encounters:  12/01/18 (!) 156/95  11/29/18 (!) 152/98  11/07/18 (!) 155/98   Exam: Normal Speech.  NAD L eye and lid appear normal, maybe very slightly puffier appearance compared to R but no erythema or frank edema  Lab and Radiology Results No results found for this or any previous visit (from the past 72 hour(s)). No results found.     Assessment and Plan: 38 y.o. male with The encounter diagnosis was Essential hypertension.   PDMP not reviewed this encounter. No orders of the defined types were placed in this encounter.  No orders of the defined types were placed in this encounter.  There are no Patient Instructions on file for this visit.  Instructions sent via MyChart. If MyChart not available, pt was given option for info via personal e-mail w/ no guarantee of protected health info over unsecured e-mail communication, and MyChart sign-up instructions were sent to patient.   Follow Up Instructions: labs +/- NV BP check 4 weeks   I  discussed the assessment and treatment plan with the patient. The patient was provided an opportunity to ask questions and all were answered. The patient agreed with the plan and demonstrated an understanding of the instructions.   The patient was advised to call back or seek an in-person evaluation if any new concerns, if symptoms worsen or if the condition fails to improve as anticipated.  Der to get labs in 4 weeks  minutes of non-face-to-face time was provided during this encounter.      . . . . . . . . . . . . . Marland Kitchen                   Historical information moved to improve visibility of documentation.  Past Medical History:  Diagnosis Date  . Essential hypertension 11/27/2016  . Family history of cardiac disorder in father 08/27/2015  . Insomnia 08/27/2015  . Sleep apnea 11/26/2016   On CPAP, last sleep study approximately 5-7 years ago    No past surgical history on file. Social History   Tobacco Use  . Smoking status: Never Smoker  . Smokeless tobacco: Never Used  Substance Use Topics  . Alcohol use: Yes    Alcohol/week: 0.0 standard drinks   family history includes Cancer in his mother; Heart attack in his father; Hypertension in his father.  Medications: Current Outpatient Medications  Medication Sig Dispense Refill  . ibuprofen (ADVIL,MOTRIN) 600 MG tablet Take  600 mg by mouth every 6 (six) hours as needed.    Marland Kitchen lisinopril (ZESTRIL) 10 MG tablet Take by mouth.    . Melatonin 1 MG CAPS Take by mouth at bedtime.     . cefdinir (OMNICEF) 300 MG capsule Take 1 capsule (300 mg total) by mouth 2 (two) times daily. (Patient not taking: Reported on 01/16/2019) 14 capsule 0  . doxycycline (VIBRA-TABS) 100 MG tablet Take 1 tablet (100 mg total) by mouth 2 (two) times daily. (Patient not taking: Reported on 01/16/2019) 20 tablet 0  . HYDROcodone-acetaminophen (NORCO) 10-325 MG tablet Take 0.5-1 tablets by mouth every 8 (eight) hours as needed.  (Patient not taking: Reported on 01/16/2019) 30 tablet 0  . trimethoprim-polymyxin b (POLYTRIM) ophthalmic solution Place 2 drops into the left eye every 6 (six) hours. (Patient not taking: Reported on 01/16/2019) 10 mL 0   No current facility-administered medications for this visit.    No Known Allergies

## 2019-01-17 ENCOUNTER — Ambulatory Visit: Payer: 59 | Admitting: Sports Medicine

## 2019-01-31 ENCOUNTER — Encounter: Payer: Self-pay | Admitting: Sports Medicine

## 2019-01-31 ENCOUNTER — Other Ambulatory Visit: Payer: Self-pay

## 2019-01-31 ENCOUNTER — Ambulatory Visit (INDEPENDENT_AMBULATORY_CARE_PROVIDER_SITE_OTHER): Payer: 59 | Admitting: Sports Medicine

## 2019-01-31 DIAGNOSIS — M5417 Radiculopathy, lumbosacral region: Secondary | ICD-10-CM

## 2019-01-31 MED ORDER — TRAMADOL HCL 50 MG PO TABS: 50.0000 mg | ORAL_TABLET | Freq: Three times a day (TID) | ORAL | 0 refills | Status: DC | PRN

## 2019-01-31 NOTE — Progress Notes (Signed)
Subjective:    CC: Back pain  HPI: This is a pleasant 38 year old male with known lumbar degenerative disc disease and a chronic right L4 distribution radiculopathy.  More recently he had severe pain, MRI showed a large L4-L5 disc extrusion with compression of the L4 nerve root, he responded well but only for a few days to a right L4-L5 transforaminal epidural, he is having recurrence of pain and would like further evaluation and definitive treatment.  No bowel or bladder dysfunction, saddle numbness, constitutional symptoms, trauma.  I reviewed the past medical history, family history, social history, surgical history, and allergies today and no changes were needed.  Please see the problem list section below in epic for further details.  Past Medical History: Past Medical History:  Diagnosis Date  . Essential hypertension 11/27/2016  . Family history of cardiac disorder in father 08/27/2015  . Insomnia 08/27/2015  . Sleep apnea 11/26/2016   On CPAP, last sleep study approximately 5-7 years ago    Past Surgical History: No past surgical history on file. Social History: Social History   Socioeconomic History  . Marital status: Married    Spouse name: Not on file  . Number of children: Not on file  . Years of education: Not on file  . Highest education level: Not on file  Occupational History  . Not on file  Tobacco Use  . Smoking status: Never Smoker  . Smokeless tobacco: Never Used  Substance and Sexual Activity  . Alcohol use: Yes    Alcohol/week: 0.0 standard drinks  . Drug use: Not Currently  . Sexual activity: Not Currently  Other Topics Concern  . Not on file  Social History Narrative  . Not on file   Social Determinants of Health   Financial Resource Strain:   . Difficulty of Paying Living Expenses: Not on file  Food Insecurity:   . Worried About Charity fundraiser in the Last Year: Not on file  . Ran Out of Food in the Last Year: Not on file  Transportation  Needs:   . Lack of Transportation (Medical): Not on file  . Lack of Transportation (Non-Medical): Not on file  Physical Activity:   . Days of Exercise per Week: Not on file  . Minutes of Exercise per Session: Not on file  Stress:   . Feeling of Stress : Not on file  Social Connections:   . Frequency of Communication with Friends and Family: Not on file  . Frequency of Social Gatherings with Friends and Family: Not on file  . Attends Religious Services: Not on file  . Active Member of Clubs or Organizations: Not on file  . Attends Archivist Meetings: Not on file  . Marital Status: Not on file   Family History: Family History  Problem Relation Age of Onset  . Cancer Mother   . Hypertension Father   . Heart attack Father    Allergies: No Known Allergies Medications: See med rec.  Review of Systems: No fevers, chills, night sweats, weight loss, chest pain, or shortness of breath.   Objective:    General: Well Developed, well nourished, and in no acute distress.  Neuro: Alert and oriented x3, extra-ocular muscles intact, sensation grossly intact.  HEENT: Normocephalic, atraumatic, pupils equal round reactive to light, neck supple, no masses, no lymphadenopathy, thyroid nonpalpable.  Skin: Warm and dry, no rashes. Cardiac: Regular rate and rhythm, no murmurs rubs or gallops, no lower extremity edema.  Respiratory: Clear to auscultation  bilaterally. Not using accessory muscles, speaking in full sentences.  Impression and Recommendations:    Left lumbosacral radiculopathy at L4 Left L4 radiculopathy, chronic mild left foot drop and mild left quad weakness. MRI did show a large extraforaminal left L4-L5 disc extrusion causing clear compression of the left extraforaminal L4 nerve root, his symptoms have evolved to involve his L5 distribution as well. His paresthesias improved considerably with a left L4-L5 transforaminal epidural, I think we should proceed with epidurals  #2 and 3, stacked 1 month apart. Adding tramadol for pain relief, and I would like a second opinion from Dr. Lynann Bologna.   ___________________________________________ Gwen Her. Dianah Field, M.D., ABFM., CAQSM. Primary Care and Sports Medicine [facility]  Adjunct Professor of [place] [employer] [facility] [employer]

## 2019-01-31 NOTE — Assessment & Plan Note (Signed)
Left L4 radiculopathy, chronic mild left foot drop and mild left quad weakness. MRI did show a large extraforaminal left L4-L5 disc extrusion causing clear compression of the left extraforaminal L4 nerve root, his symptoms have evolved to involve his L5 distribution as well. His paresthesias improved considerably with a left L4-L5 transforaminal epidural, I think we should proceed with epidurals #2 and 3, stacked 1 month apart. Adding tramadol for pain relief, and I would like a second opinion from Dr. Lynann Bologna.

## 2019-02-07 ENCOUNTER — Ambulatory Visit
Admission: RE | Admit: 2019-02-07 | Discharge: 2019-02-07 | Disposition: A | Discharge status: Home / Self Care | Payer: 59 | Source: Ambulatory Visit | Attending: Sports Medicine | Admitting: Sports Medicine

## 2019-02-07 DIAGNOSIS — M5417 Radiculopathy, lumbosacral region: Secondary | ICD-10-CM

## 2019-02-07 MED ORDER — IOPAMIDOL (ISOVUE-M 200) INJECTION 41%
1.0000 mL | Freq: Once | INTRAMUSCULAR | Status: AC
  Administered 2019-02-07: 1 mL via EPIDURAL

## 2019-02-07 MED ORDER — METHYLPREDNISOLONE ACETATE 40 MG/ML INJ SUSP (RADIOLOG
120.0000 mg | Freq: Once | INTRAMUSCULAR | Status: AC
  Administered 2019-02-07: 14:00:00 120 mg via EPIDURAL

## 2019-02-15 ENCOUNTER — Telehealth: Payer: Self-pay | Admitting: Osteopathic Medicine

## 2019-02-15 NOTE — Telephone Encounter (Addendum)
-----   Message from Emeterio Reeve, DO sent at 01/16/2019  5:16 PM EST ----- Need labs to recheck labs after starting lisinopril - orders are in, can we remind him to come to lab (and also set up mychart)  Also if he can tel [place] what BP is, that woul dbe excellent!

## 2019-02-16 MED ORDER — LISINOPRIL 10 MG PO TABS: 10.0000 mg | ORAL_TABLET | Freq: Every day | ORAL | 0 refills | Status: DC

## 2019-02-16 NOTE — Telephone Encounter (Signed)
Patient advised.   Recent readings 120s/80s at doctors appointment.   Needs refill, sending in 30 day and he will get labs done next week

## 2019-02-24 ENCOUNTER — Telehealth: Payer: Self-pay

## 2019-02-24 MED ORDER — HYDROCODONE-ACETAMINOPHEN 5-325 MG PO TABS: 1.0000 | ORAL_TABLET | Freq: Three times a day (TID) | ORAL | 0 refills | Status: DC | PRN

## 2019-02-24 NOTE — Telephone Encounter (Signed)
Left a message advising of the refill.

## 2019-02-24 NOTE — Telephone Encounter (Signed)
Calling in a bit of hydrocodone,I think he is going to ultimately try his third epidural injection, and if persistent discomfort Dr. Lynann Bologna was going to operate.

## 2019-02-24 NOTE — Telephone Encounter (Signed)
Carless's wife called and states he is still in pain. She states the tramadol is not helping. She states he is wanting Hydrocodone. Please advise.

## 2019-03-15 ENCOUNTER — Other Ambulatory Visit: Payer: Self-pay

## 2019-03-15 MED ORDER — LISINOPRIL 10 MG PO TABS: 10.0000 mg | ORAL_TABLET | Freq: Every day | ORAL | 0 refills | Status: DC

## 2019-03-16 ENCOUNTER — Telehealth: Payer: Self-pay | Admitting: *Deleted

## 2019-03-16 DIAGNOSIS — M5417 Radiculopathy, lumbosacral region: Secondary | ICD-10-CM

## 2019-03-16 NOTE — Telephone Encounter (Signed)
Pt needs an order placed with [facility] for his 3rd epidural.

## 2019-03-17 NOTE — Telephone Encounter (Signed)
LMOM with Angelita Ingles at [facility].

## 2019-03-17 NOTE — Telephone Encounter (Signed)
Happy to do this, per Dr. Laurena Bering evaluation, he feels as though the L5 nerve root is more involved, so we will try this as a left L5-S1 transforaminal epidural this time which will address the L5 nerve root rather than the L4.

## 2019-03-22 ENCOUNTER — Ambulatory Visit
Admission: RE | Admit: 2019-03-22 | Discharge: 2019-03-22 | Disposition: A | Discharge status: Home / Self Care | Payer: 59 | Source: Ambulatory Visit | Attending: Sports Medicine | Admitting: Sports Medicine

## 2019-03-22 DIAGNOSIS — M5417 Radiculopathy, lumbosacral region: Secondary | ICD-10-CM

## 2019-03-22 MED ORDER — METHYLPREDNISOLONE ACETATE 40 MG/ML INJ SUSP (RADIOLOG
120.0000 mg | Freq: Once | INTRAMUSCULAR | Status: AC
  Administered 2019-03-22: 120 mg via EPIDURAL

## 2019-03-22 MED ORDER — IOPAMIDOL (ISOVUE-M 200) INJECTION 41%
1.0000 mL | Freq: Once | INTRAMUSCULAR | Status: AC
  Administered 2019-03-22: 13:00:00 1 mL via EPIDURAL

## 2019-03-22 NOTE — Discharge Instructions (Signed)
Spinal Injection Discharge Instruction Sheet  1. You may resume a regular diet and any medications that you routinely take, including pain medications.  2. No driving the rest of the day of the procedure.  3. Light activity throughout the rest of the day.  Do not do any strenuous work, exercise, bending or lifting.  The day following the procedure, you may resume normal physical activity but you should refrain from exercising or physical therapy for at least three days.   Common Side Effects:   Headaches- take your usual medications as directed by your physician.     Restlessness or inability to sleep- you may have trouble sleeping for the next few days.  Ask your referring physician if you need any medication for sleep if over the counter sleep medications do not help.   Facial flushing or redness- this should subside within a few days.   Increased pain- a temporary increase in pain a day or two following your procedure is not unusual.  Take your pain medication as prescribed by your referring physician.  You may use ice to the injection site as needed.  Please do not use heat for 24 hours.   Leg cramps  Please contact our office at [phone] for the following symptoms:  Fever greater than 100 degrees.  Headaches unresolved with medication after 2-3 days.  Increased swelling, pain, or redness at injection site.  Thank you for visiting our office.   You may resume Excedrine today. 

## 2019-04-04 ENCOUNTER — Telehealth: Payer: Self-pay

## 2019-04-04 NOTE — Telephone Encounter (Signed)
If patient has concerns about his medical care, he should set up a visit with me to discuss in detail. Patients are always advised by me, at each visit, to let me know if questions/concerns arise.

## 2019-04-04 NOTE — Telephone Encounter (Signed)
Contacted pt multiple times, bad connection during calls. Tried leaving a vm msg - call was cut off before leaving a message. Will try calling pt at another time.

## 2019-04-04 NOTE — Telephone Encounter (Signed)
Pt's wife called on behalf of pt. As per pt's wife, pt has stopped taking the lisinopril rx. Pt was having s/e from medication and was not feeling well on the medication. No specific details given regarding s/e. No other inquiries mentioned in the vm msg.

## 2019-09-07 ENCOUNTER — Other Ambulatory Visit: Payer: Self-pay | Admitting: *Deleted

## 2019-09-07 DIAGNOSIS — M5417 Radiculopathy, lumbosacral region: Secondary | ICD-10-CM

## 2019-09-08 MED ORDER — IBUPROFEN 600 MG PO TABS: 600.0000 mg | ORAL_TABLET | Freq: Four times a day (QID) | ORAL | 11 refills | Status: DC | PRN

## 2019-10-11 ENCOUNTER — Telehealth: Payer: Self-pay | Admitting: *Deleted

## 2019-10-11 MED ORDER — CELECOXIB 200 MG PO CAPS: ORAL_CAPSULE | ORAL | 2 refills | Status: DC

## 2019-10-11 NOTE — Telephone Encounter (Signed)
We can switch to celecoxib, does he want to try another epidural?

## 2019-10-11 NOTE — Telephone Encounter (Signed)
Pt's wife left vm wanting to know if you could send in something stronger for pain than Ibuprofen 600mg  for the pt.  She stated that he takes these and still is in a lot of pain.  Please advise.

## 2019-10-12 NOTE — Telephone Encounter (Signed)
Pt notified and will try the Celebrex for now.

## 2019-11-10 ENCOUNTER — Other Ambulatory Visit: Payer: Self-pay

## 2019-11-10 ENCOUNTER — Emergency Department: Payer: 59

## 2019-11-10 ENCOUNTER — Emergency Department
Admission: EM | Admit: 2019-11-10 | Discharge: 2019-11-10 | Disposition: A | Discharge status: Home / Self Care | Payer: 59 | Source: Home / Self Care

## 2019-11-10 ENCOUNTER — Telehealth: Payer: Self-pay | Admitting: Emergency Medicine

## 2019-11-10 DIAGNOSIS — M79662 Pain in left lower leg: Secondary | ICD-10-CM

## 2019-11-10 DIAGNOSIS — M25562 Pain in left knee: Secondary | ICD-10-CM

## 2019-11-10 NOTE — ED Provider Notes (Signed)
[facility]    CSN: [id number] Arrival date & time: 11/10/19  0845      History   Chief Complaint Chief Complaint  Patient presents with  . Knee Pain    Left    HPI Wesley Pacheco is a 39 y.o. male.   HPI Wesley Pacheco is a 39 y.o. male presenting to UC with c/o Left knee pain and swelling that started 2 days ago after driving to [facility] in 2 days and getting on a plane home the next day.  Pt concerned for a blood clot. Pain is aching and sore behind his knee and proximal calf, 2/10, worse with ambulating and flexing his knee.  Denies redness or warmth. No hx of clots. Denies chest pain or SOB.   BP elevated in triage, hx of same when in pain.    Past Medical History:  Diagnosis Date  . Essential hypertension 11/27/2016  . Family history of cardiac disorder in father 08/27/2015  . Insomnia 08/27/2015  . Sleep apnea 11/26/2016   On CPAP, last sleep study approximately 5-7 years ago     Patient Active Problem List   Diagnosis Date Noted  . Left lumbosacral radiculopathy at L4 09/21/2018  . Patellar tendinitis, left knee, proximal 07/25/2018  . 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 Benign paroxysmal positional vertigo 07/09/2017  . Mild hyperlipidemia 12/10/2016  . Elevated ALT measurement 12/10/2016  . Benign nevus of skin 11/27/2016  . Essential hypertension 11/27/2016  . Sleep apnea 11/26/2016  . Paresthesia of bilateral legs 08/27/2015  . Family history of cardiac disorder in father 08/27/2015  . Insomnia 08/27/2015    History reviewed. No pertinent surgical history.     Home Medications    Prior to Admission medications   Medication Sig Start Date End Date Taking? Authorizing Provider  celecoxib (CELEBREX) 200 MG capsule One to 2 tablets by mouth daily as needed for pain. 10/11/19   Silverio Decamp, MD  HYDROcodone-acetaminophen (NORCO/VICODIN) 5-325 MG tablet Take 1 tablet by mouth every 8 (eight) hours as needed for moderate  pain. 02/24/19   Silverio Decamp, MD  lisinopril (ZESTRIL) 10 MG tablet Take 1 tablet (10 mg total) by mouth daily. 03/15/19 04/14/19  Emeterio Reeve, DO  Melatonin 1 MG CAPS Take by mouth at bedtime.     [provider]  traMADol (ULTRAM) 50 MG tablet Take 1-2 tablets (50-100 mg total) by mouth every 8 (eight) hours as needed for moderate pain. Maximum 6 tabs per day. 01/31/19   Silverio Decamp, MD    Family History Family History  Problem Relation Age of Onset  . Cancer Mother   . Hypertension Father   . Heart attack Father     Social History Social History   Tobacco Use  . Smoking status: Never Smoker  . Smokeless tobacco: Never Used  Vaping Use  . Vaping Use: Never used  Substance Use Topics  . Alcohol use: Not Currently    Alcohol/week: 0.0 standard drinks  . Drug use: Not Currently     Allergies   Patient has no known allergies.   Review of Systems Review of Systems  Musculoskeletal: Positive for arthralgias, gait problem, joint swelling and myalgias.  Skin: Negative for color change and wound.     Physical Exam Triage Vital Signs ED Triage Vitals  Enc Vitals Group     BP 11/10/19 0911 (!) 145/104     Pulse Rate 11/10/19 0911 81     Resp  11/10/19 0911 16     Temp 11/10/19 0911 98.9 F (37.2 C)     Temp Source 11/10/19 0911 Oral     SpO2 11/10/19 0911 99 %     Weight --      Height --      Head Circumference --      Peak Flow --      Pain Score 11/10/19 0909 2     Pain Loc --      Pain Edu? --      Excl. in [place]? --    No data found.  Updated Vital Signs BP (!) 145/104 (BP Location: Left Arm)   Pulse 81   Temp 98.9 F (37.2 C) (Oral)   Resp 16   SpO2 99%   Visual Acuity Right Eye Distance:   Left Eye Distance:   Bilateral Distance:    Right Eye Near:   Left Eye Near:    Bilateral Near:     Physical Exam Vitals and nursing note reviewed.  Constitutional:      Appearance: Normal appearance. He is well-developed.   HENT:     Head: Normocephalic and atraumatic.  Cardiovascular:     Rate and Rhythm: Normal rate.  Pulmonary:     Effort: Pulmonary effort is normal.  Musculoskeletal:        General: Swelling and tenderness present. Normal range of motion.     Cervical back: Normal range of motion.     Comments: Left knee: mild edema, mild tenderness to posterior lateral aspect. Increased pain with full knee flexion. Left calf: soft, mild tenderness to posterior lateral aspect.  Skin:    General: Skin is warm and dry.     Findings: No bruising or erythema.  Neurological:     Mental Status: He is alert and oriented to person, place, and time.  Psychiatric:        Behavior: Behavior normal.      UC Treatments / Results  Labs (all labs ordered are listed, but only abnormal results are displayed) Labs Reviewed - No data to display  EKG   Radiology US Venous Img Lower Unilateral Left  Result Date: 11/10/2019 CLINICAL DATA:  Left posterior knee and calf pain EXAM: LEFT LOWER EXTREMITY VENOUS DOPPLER ULTRASOUND TECHNIQUE: Gray-scale sonography with compression, as well as color and duplex ultrasound, were performed to evaluate the deep venous system(s) from the level of the common femoral vein through the popliteal and proximal calf veins. COMPARISON:  None. FINDINGS: VENOUS Normal compressibility of the common femoral, superficial femoral, and popliteal veins, as well as the visualized calf veins. Visualized portions of profunda femoral vein and great saphenous vein unremarkable. No filling defects to suggest DVT on grayscale or color Doppler imaging. Doppler waveforms show normal direction of venous flow, normal respiratory plasticity and response to augmentation. Limited views of the contralateral common femoral vein are unremarkable. OTHER None. Limitations: none IMPRESSION: Negative. Electronically Signed   By: Rolm Baptise M.D.   On: 11/10/2019 10:02    Procedures Procedures (including critical  care time)  Medications Ordered in UC Medications - No data to display  Initial Impression / Assessment and Plan / UC Course  I have reviewed the triage vital signs and the nursing notes.  Pertinent labs & imaging results that were available during my care of the patient were reviewed by me and considered in my medical decision making (see chart for details).     Discussed imaging with pt Reassured of no evidence  of DVT Encouraged f/u with PCP or Sports Medicine next week AVS given  Final Clinical Impressions(s) / UC Diagnoses   Final diagnoses:  Posterior left knee pain  Pain of left calf     Discharge Instructions      You may take 500mg  acetaminophen every 4-6 hours or in combination with ibuprofen 400-600mg  every 6-8 hours as needed for pain and inflammation.  Call to schedule a follow up appointment with Sports Medicine or primary care next week for recheck of symptoms if not improving.     ED Prescriptions    None     PDMP not reviewed this encounter.   Noe Gens, PA-C 11/10/19 1021

## 2019-11-10 NOTE — Telephone Encounter (Signed)
Patient called for pain meds, Junie Panning advised him to ice, elevate, ibuprofen and Tylenol and follow up with Dr T if his pain is that severe on Monday.

## 2019-11-10 NOTE — ED Triage Notes (Signed)
Patient presents to Urgent Care with complaints of left knee pain and swelling since two days ago. 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  Pain posterior knee, is concerned for possible blood clot.

## 2019-11-10 NOTE — Discharge Instructions (Signed)
  You may take 500mg  acetaminophen every 4-6 hours or in combination with ibuprofen 400-600mg  every 6-8 hours as needed for pain and inflammation.  Call to schedule a follow up appointment with Sports Medicine or primary care next week for recheck of symptoms if not improving.

## 2020-09-16 ENCOUNTER — Other Ambulatory Visit: Payer: Self-pay

## 2020-09-16 ENCOUNTER — Emergency Department
Admission: EM | Admit: 2020-09-16 | Discharge: 2020-09-16 | Disposition: A | Discharge status: Home / Self Care | Payer: 59 | Source: Home / Self Care | Attending: Family Medicine | Admitting: Family Medicine

## 2020-09-16 DIAGNOSIS — M25562 Pain in left knee: Secondary | ICD-10-CM

## 2020-09-16 DIAGNOSIS — M25462 Effusion, left knee: Secondary | ICD-10-CM | POA: Diagnosis not present

## 2020-09-16 MED ORDER — HYDROCODONE-ACETAMINOPHEN 7.5-325 MG PO TABS
1.0000 | ORAL_TABLET | Freq: Four times a day (QID) | ORAL | 0 refills | Status: DC | PRN
Start: 2020-09-16 — End: 2021-04-03

## 2020-09-16 MED ORDER — IBUPROFEN 800 MG PO TABS: 800.0000 mg | ORAL_TABLET | Freq: Three times a day (TID) | ORAL | 0 refills | Status: DC | PRN

## 2020-09-16 NOTE — ED Triage Notes (Signed)
Pt seen in UC w/ c/o LT knee pain and swelling. Pt states sx began on Friday and pain worsens at night. Pt has been taking OTC aleve for pain and using ice.

## 2020-09-16 NOTE — Discharge Instructions (Addendum)
Take Motrin (ibuprofen) 3 times a day with food.  Take as needed for pain and swelling.  This will help reduce inflammation. When pain is severe take hydrocodone.  Do not drive on hydrocodone. Follow-up with your primary care doctor, or Dr. Darene Lamer for ongoing problems Use ice for 20 minutes every couple of hours through today and limit your walking

## 2020-09-16 NOTE — ED Provider Notes (Signed)
[facility]    CSN: [id number] Arrival date & time: 09/16/20  0815      History   Chief Complaint Chief Complaint  Patient presents with   Knee Pain    LT knee w/ swelling    HPI Wesley Pacheco is a 40 y.o. male.   HPI  Longstanding knee pain.  Chart is reviewed.  He had left knee pain and June 2020 and had x-ray, MRI, physical therapy, and prescription medications.  He has knee pain periodically.  Currently his pain is been going on for 4 days.  It happened after a normal day at work.  No accident or injury.  No fall.  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  He has a large amount of swelling and pain at this time. Patient also has chronic back condition with "nerve damage".  This appears to be present both legs left greater than right He has a history of hypertension.  He took lisinopril.  He did not like the way this made him feel.  He quit his lisinopril.  He states when he takes his blood pressure it is always normal at home but it is elevated when he is stressed or at the doctor's office.  He does not feel he needs blood pressure medicine.  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 No pertinent surgical history.     Home Medications    Prior to Admission medications   Medication Sig Start Date End Date Taking? Authorizing Provider  HYDROcodone-acetaminophen (NORCO) 7.5-325 MG tablet Take 1 tablet by mouth every 6 (six) hours as needed for moderate pain. 09/16/20  Yes Raylene Everts, MD  ibuprofen (ADVIL) 800 MG tablet Take 1 tablet (800 mg total) by mouth every 8 (eight) hours as needed for moderate pain. 09/16/20  Yes Raylene Everts, MD  Melatonin 1 MG CAPS Take by mouth at bedtime.     [provider]    Family History Family History  Problem Relation Age of Onset   Cancer Mother    Hypertension Father    Heart attack Father     Social History Social History   Tobacco Use   Smoking status: Never   Smokeless tobacco: Never  Vaping Use   Vaping Use: Never used  Substance Use Topics   Alcohol use: Not Currently    Alcohol/week: 0.0 standard drinks   Drug use: Not Currently     Allergies   Patient has no known allergies.   Review of Systems Review of Systems See HPI-  Physical Exam Triage Vital Signs  ED Triage Vitals [09/16/20 0831]  Enc Vitals Group     BP (!) 144/102     Pulse Rate 87     Resp 14     Temp 98.6 F (37 C)     Temp Source Oral     SpO2 98 %     Weight (!) 325 lb (147.4 kg)     Height '6\' 2"'$  (1.88 m)     Head Circumference      Peak Flow      Pain Score 4     Pain Loc      Pain Edu?      Excl. in [place]?    No data found.  Updated Vital Signs BP (!) 144/102 (BP Location: Right Arm)   Pulse 87   Temp 98.6 F (37 C) (Oral)   Resp 14   Ht '6\' 2"'$  (1.88 m)   Wt (!) 147.4 kg   SpO2 98%   BMI 41.73 kg/m      Physical Exam Constitutional:      General: He is not in acute distress.    Appearance: Normal appearance. He is well-developed. He is obese.  HENT:     Head: Normocephalic and  atraumatic.     Mouth/Throat:     Comments: Mask is in place Eyes:     Conjunctiva/sclera: Conjunctivae normal.     Pupils: Pupils are equal, round, and reactive to light.  Cardiovascular:     Rate and Rhythm: Normal rate.  Pulmonary:     Effort: Pulmonary effort is normal. No respiratory distress.  Abdominal:     General: There is no distension.     Palpations: Abdomen is soft.  Musculoskeletal:        General: Normal range of motion.     Cervical back: Normal range of motion.     Comments: Right knee has full range of motion with no effusion or tenderness.  No crepitus.  Left knee has an obvious tense effusion.  Patient is unable to fully flex or extend his knee.  He has tenderness around the medial joint line.  No instability to Limited examination secondary to his size, and pain.  Skin:    General: Skin is warm and dry.  Neurological:     Mental Status: He is alert.     Gait: Gait abnormal.  Psychiatric:        Mood and Affect: Mood normal.        Behavior: Behavior normal.     UC Treatments / Results  Labs (all labs ordered are listed, but only abnormal results are displayed) Labs Reviewed - No data to display  EKG   Radiology No results found.  Procedures Join Aspiration/Injection  Date/Time: 09/16/2020 10:06 AM Performed by: Raylene Everts, MD Authorized by: Raylene Everts, MD   Consent:    Consent obtained:  Verbal   Consent given by:  Patient   Risks discussed:  Incomplete drainage and pain   Alternatives discussed:  Alternative treatment Universal protocol:    Patient identity confirmed:  Verbally with patient and arm band Location:    Location:  Knee   Knee:  L knee Anesthesia:    Anesthesia method:  Local infiltration   Local anesthetic:  Lidocaine 2% w/o epi Procedure details:    Preparation: Patient was prepped and draped in usual sterile fashion     Needle gauge:  18 G   Ultrasound guidance: no     Approach:  Lateral  Aspirate  amount:  65cc   Aspirate characteristics:  Clear and yellow   Steroid injected: yes     Specimen collected: no   Post-procedure details:    Dressing:  Adhesive bandage   Procedure completion:  Tolerated well, no immediate complications Comments:     Patient reports immediate improvement in stiffness and moderate improvement in pain (including critical care time)  Medications Ordered in UC Medications - No data to display  Initial Impression / Assessment and Plan / UC Course  I have reviewed the triage vital signs and the nursing notes.  Pertinent labs & imaging results that were available during my care of the patient were reviewed by me and considered in my medical decision making (see chart for details).     Patient has seen Dr. Dianah Field in sports medicine in the past.  He has been advised to follow-up with him if he does not see improvement.  We discussed the use of anti-inflammatory medicines and ice when his knee starts to bother him.  He has gone to physical therapy and notes knee exercises.  Knee injection discussed and postinjection care Final Clinical Impressions(s) / UC Diagnoses   Final diagnoses:  Acute pain of left knee  Effusion of left knee     Discharge Instructions      Take Motrin (ibuprofen) 3 times a day with food.  Take as needed for pain and swelling.  This will help reduce inflammation. When pain is severe take hydrocodone.  Do not drive on hydrocodone. Follow-up with your primary care doctor, or Dr. Darene Lamer for ongoing problems Use ice for 20 minutes every couple of hours through today and limit your walking   ED Prescriptions     Medication Sig Dispense Auth. Provider   ibuprofen (ADVIL) 800 MG tablet Take 1 tablet (800 mg total) by mouth every 8 (eight) hours as needed for moderate pain. 90 tablet Raylene Everts, MD   HYDROcodone-acetaminophen [facility]) 7.5-325 MG tablet Take 1 tablet by mouth every 6 (six) hours as needed for moderate pain. 15  tablet Raylene Everts, MD      I have reviewed the PDMP during this encounter.   Raylene Everts, MD 09/16/20 [phone]

## 2020-10-30 IMAGING — MR MRI OF THE LEFT KNEE WITHOUT CONTRAST
7 series · 40 of 40 positions shown · non-contrast
Comparison: 07/25/2018

CLINICAL DATA: Left lateral knee pain intermittently. Knee
swelling.

EXAM:
MRI OF THE LEFT KNEE WITHOUT CONTRAST
TECHNIQUE: Multiplanar, multisequence MR imaging of the knee was performed. No
intravenous contrast was administered.

[Series 3: T2 fat-sat · axial · 4.0mm · 0.56mm/px · z∈[-85,+85]mm · 7 of 35 slices shown (1 of 3)]
[im 1/35]
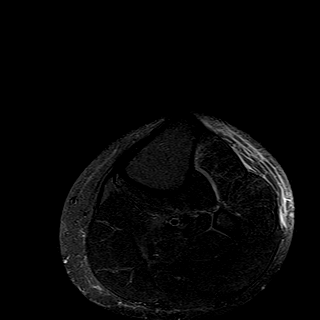
[im 6/35]
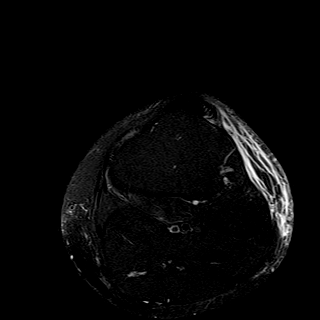
[im 12/35]
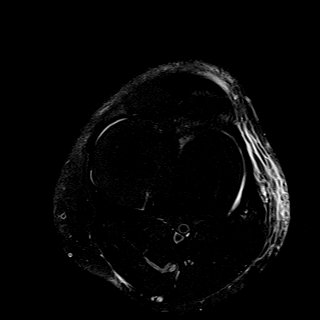
[im 18/35]
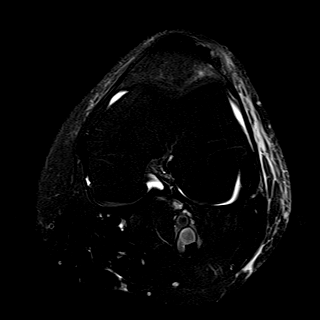
[im 23/35]
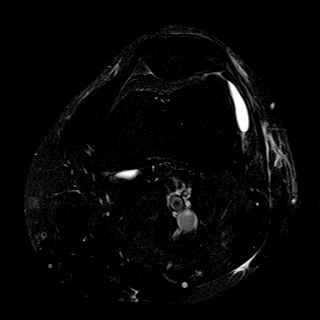
[im 29/35]
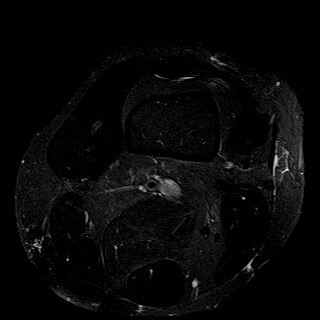
[im 35/35]
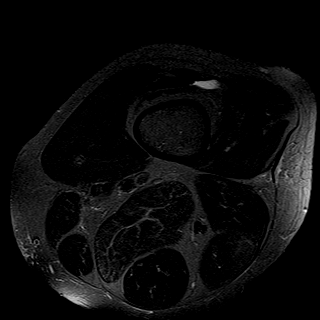

[Series 4: T1 · coronal · 4.0mm · 0.62mm/px · 6 of 35 slices shown]
[im 1/35]
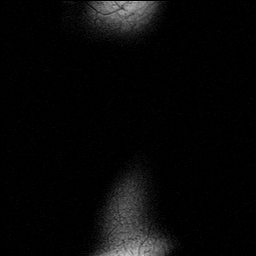
[im 7/35]
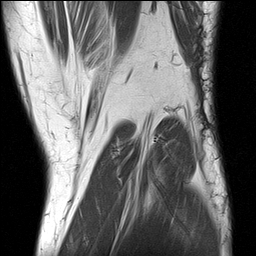
[im 14/35]
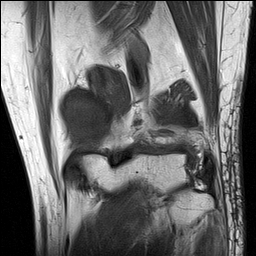
[im 21/35]
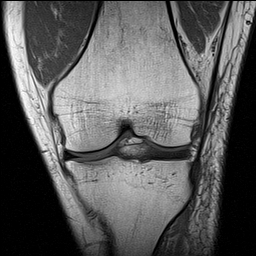
[im 28/35]
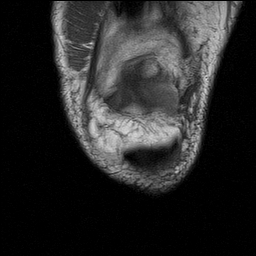
[im 35/35]
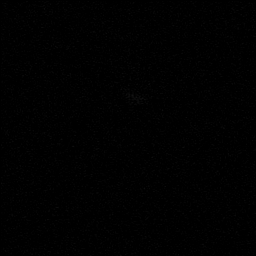

[Series 5: T2 fat-sat · coronal · 4.0mm · 0.62mm/px · 6 of 35 slices shown (2 of 3)]
[im 1/35]
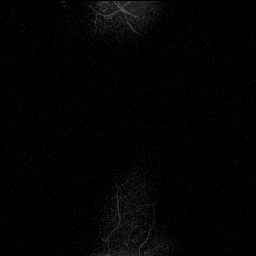
[im 7/35]
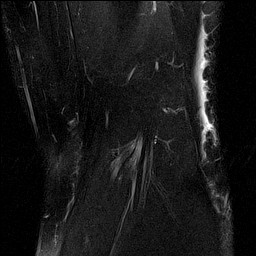
[im 14/35]
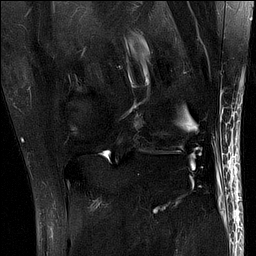
[im 21/35]
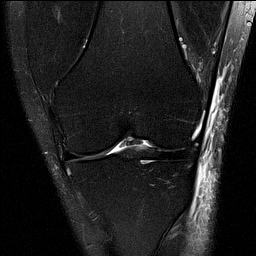
[im 28/35]
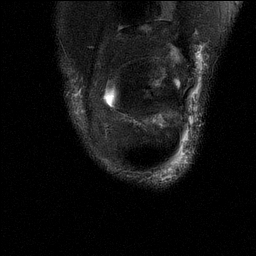
[im 35/35]
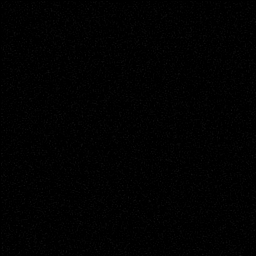

[Series 6: PD fat-sat · coronal · 4.0mm · 0.62mm/px · 6 of 35 slices shown (1 of 3)]
[im 1/35]
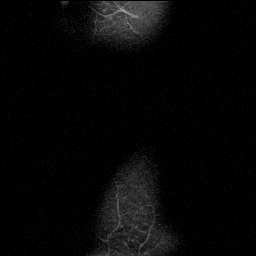
[im 7/35]
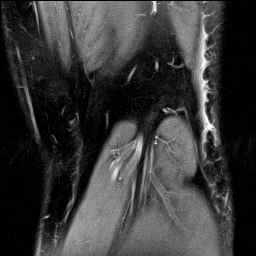
[im 14/35]
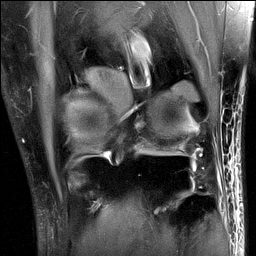
[im 21/35]
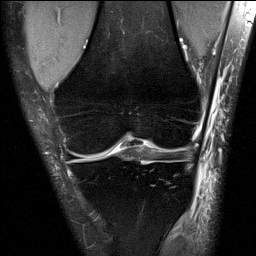
[im 28/35]
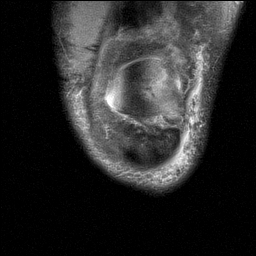
[im 35/35]
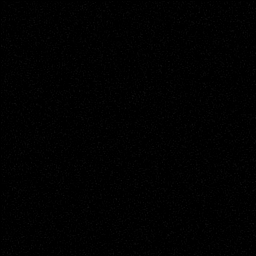

[Series 7: PD fat-sat · sagittal · 3.0mm · 0.66mm/px · 6 of 38 slices shown (2 of 3)]
[im 1/38]
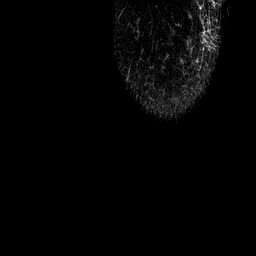
[im 8/38]
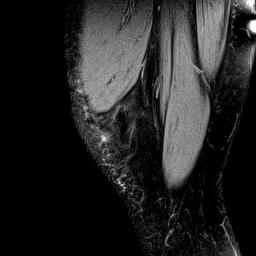
[im 15/38]
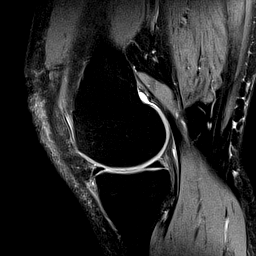
[im 23/38]
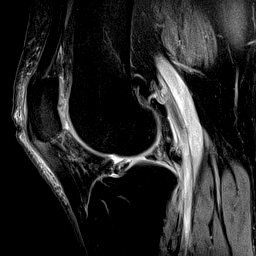
[im 30/38]
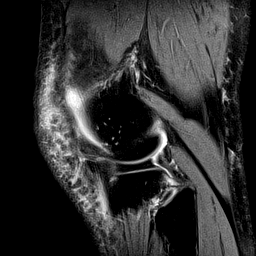
[im 38/38]
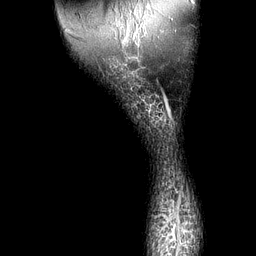

[Series 8: T2 fat-sat · sagittal · 3.0mm · 0.62mm/px · 6 of 38 slices shown (3 of 3)]
[im 1/38]
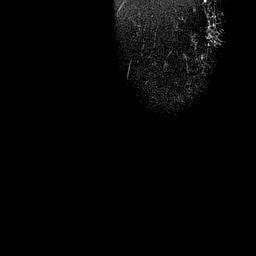
[im 8/38]
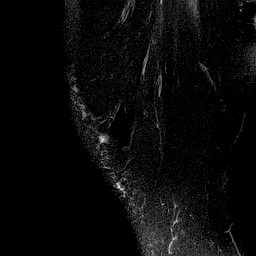
[im 15/38]
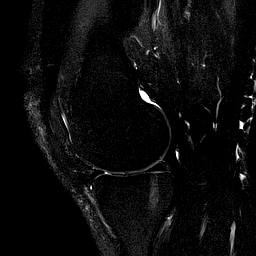
[im 23/38]
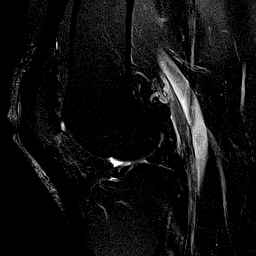
[im 30/38]
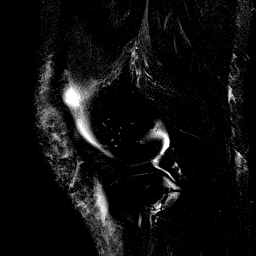
[im 38/38]
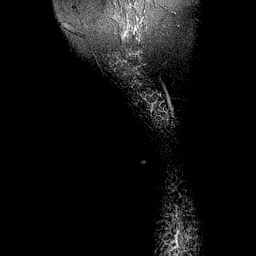

[Series 9: PD fat-sat · coronal · 2.0mm · 0.62mm/px · 3 of 19 slices shown (3 of 3)]
[im 1/19]
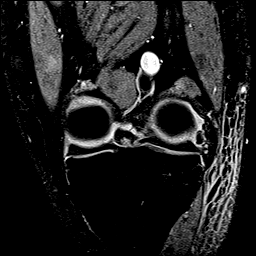
[im 10/19]
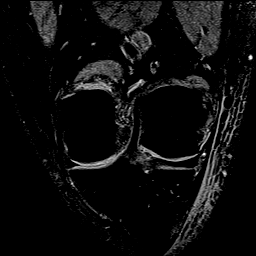
[im 19/19]
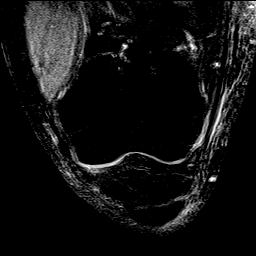

[40 of 40 positions shown; findings below may reference images not displayed]

FINDINGS: MENISCI

Medial meniscus:  Unremarkable

Lateral meniscus:  Unremarkable

LIGAMENTS

Cruciates: Sharply defined internal band of abnormal accentuated T2
signal extending in the posterior cruciate ligament favoring
posterior cruciate ligament degeneration. ACL intact.

Collaterals: Thick but otherwise intact proximal fibular collateral
ligament. MCL intact.

CARTILAGE

Patellofemoral: Mild chondral thinning along the medial facet.
Chondral heterogeneity along the lateral facet favoring mild
chondromalacia.

Medial:  Mild degenerative chondral thinning.

Lateral:  Unremarkable

Joint:  Trace knee effusion.

Popliteal Fossa:  Small Baker's cyst.

Extensor Mechanism: Proximal patellar tendinopathy mild thickening
of the patellar tendon. Edema the fibrocartilaginous extension of
the quadriceps tendon. Subtle underlying marrow edema anteriorly in
the patella. Prepatellar subcutaneous edema. There is also edema
anterior to the lateral patellar retinaculum without retinacular
discontinuity.

Bones:  Unremarkable

Other: No supplemental non-categorized findings.
IMPRESSION: 1. Sharply accentuated band of abnormal accentuated T2 signal in the
posterior cruciate ligament and some indistinctness of the proximal
PCL. Appearance favors degeneration or prior partial tear.
2. Proximal patellar tendinopathy.
3. Edema in the fibrocartilaginous extension of the quadriceps
tendon along with prepatellar subcutaneous edema and subtle marrow
edema anteriorly in the patella.
4. Mild chondral thinning medially in the patellofemoral joint and
in the medial compartment. Mild chondromalacia along the lateral
patellar facet.
5. Trace knee effusion and small Baker's cyst.

## 2020-12-02 ENCOUNTER — Ambulatory Visit: Payer: 59 | Admitting: Family Medicine

## 2020-12-02 ENCOUNTER — Encounter: Payer: Self-pay | Admitting: Family Medicine

## 2020-12-02 ENCOUNTER — Other Ambulatory Visit: Payer: Self-pay

## 2020-12-02 DIAGNOSIS — F418 Other specified anxiety disorders: Secondary | ICD-10-CM

## 2020-12-02 MED ORDER — ESCITALOPRAM OXALATE 10 MG PO TABS: ORAL_TABLET | ORAL | 3 refills | Status: DC

## 2020-12-02 MED ORDER — QUETIAPINE FUMARATE 25 MG PO TABS: 25.0000 mg | ORAL_TABLET | Freq: Every day | ORAL | 1 refills | Status: DC

## 2020-12-02 NOTE — Patient Instructions (Signed)

## 2020-12-02 NOTE — Assessment & Plan Note (Addendum)
We discussed options for treatment of his depression and anxiety.  Starting Lexapro 5 mg initially with increase to 10 mg after first week.  Also adding Seroquel at bedtime to help with associated insomnia.  There may be some features of hypomania as well and addition of Seroquel could be beneficial.

## 2020-12-02 NOTE — Progress Notes (Signed)
Wesley Pacheco - 40 y.o. male MRN [id number]  Date of birth: [birth year]  Subjective Chief Complaint  Patient presents with   Depression   Back Pain    HPI Wesley Pacheco is a 40 year old male here today with complaint of depression with anxiety and insomnia.  He reports symptoms began a couple years ago.  He reports cycling between dysphoric and euphoric moods.  He also reports he is not sleeping well.  He has not taken medications in the past for treatment of this.  He has seen a therapist previously but this was several years ago after his mother passed away.  Depression screen [facility] 2/9 12/02/2020 11/26/2016  Decreased Interest 1 2  Down, Depressed, Hopeless 3 2  PHQ - 2 Score 4 4  Altered sleeping 3 3  Tired, decreased energy 2 1  Change in appetite 3 2  Feeling bad or failure about yourself  2 1  Trouble concentrating 3 2  Moving slowly or fidgety/restless 2 0  Suicidal thoughts 0 0  PHQ-9 Score 19 13  Difficult doing work/chores Somewhat difficult -   GAD 7 : Generalized Anxiety Score 12/02/2020  Nervous, Anxious, on Edge 2  Control/stop worrying 2  Worry too much - different things 2  Trouble relaxing 2  Restless 1  Easily annoyed or irritable 3  Afraid - awful might happen 3  Total GAD 7 Score 15  Anxiety Difficulty Somewhat difficult    ROS:  A comprehensive ROS was completed and negative except as noted per HPI  No Known Allergies  Past Medical History:  Diagnosis Date   Essential hypertension 11/27/2016   Family history of cardiac disorder in father 08/27/2015   Insomnia 08/27/2015   Sleep apnea 11/26/2016   On CPAP, last sleep study approximately 5-7 years ago     No past surgical history on file.  Social History   Socioeconomic History   Marital status: Married    Spouse name: Not on file   Number of children: Not on file   Years of education: Not on file   Highest education level: Not on file  Occupational History   Not on file  Tobacco Use   Smoking  status: Never   Smokeless tobacco: Never  Vaping Use   Vaping Use: Never used  Substance and Sexual Activity   Alcohol use: Not Currently    Alcohol/week: 0.0 standard drinks   Drug use: Not Currently   Sexual activity: Not Currently  Other Topics Concern   Not on file  Social History Narrative   Not on file   Social Determinants of Health   Financial Resource Strain: Not on file  Food Insecurity: Not on file  Transportation Needs: Not on file  Physical Activity: Not on file  Stress: Not on file  Social Connections: Not on file    Family History  Problem Relation Age of Onset   Cancer Mother    Hypertension Father    Heart attack Father     Health Maintenance  Topic Date Due   COVID-19 Vaccine (1) Never done   HIV Screening  Never done   Hepatitis C Screening  Never done   TETANUS/TDAP  Never done   HPV VACCINES  Aged Out   INFLUENZA VACCINE  Discontinued     ----------------------------------------------------------------------------------------------------------------------------------------------------------------------------------------------------------------- Physical Exam BP (!) 161/95 (BP Location: Left Arm, Patient Position: Sitting, Cuff Size: Large)   Pulse 66   Temp 98.1 F (36.7 C)   Ht 6\' 2"  (1.88 m)  Wt (!) 337 lb (152.9 kg)   SpO2 96%   BMI 43.27 kg/m   Physical Exam Constitutional:      Appearance: Normal appearance.  Eyes:     General: No scleral icterus. Cardiovascular:     Rate and Rhythm: Normal rate and regular rhythm.  Pulmonary:     Effort: Pulmonary effort is normal.     Breath sounds: Normal breath sounds.  Musculoskeletal:     Cervical back: Neck supple.  Neurological:     General: No focal deficit present.     Mental Status: He is alert.     ------------------------------------------------------------------------------------------------------------------------------------------------------------------------------------------------------------------- Assessment and Plan  Depression with anxiety We discussed options for treatment of his depression and anxiety.  Starting Lexapro 5 mg initially with increase to 10 mg after first week.  Also adding Seroquel at bedtime to help with associated insomnia.  There may be some features of hypomania as well and addition of Seroquel could be beneficial.   Meds ordered this encounter  Medications   escitalopram (LEXAPRO) 10 MG tablet    Sig: Take 5mg  daily x1 week then increase to 10mg  daily    Dispense:  30 tablet    Refill:  3   QUEtiapine (SEROQUEL) 25 MG tablet    Sig: Take 1-2 tablets (25-50 mg total) by mouth at bedtime.    Dispense:  30 tablet    Refill:  1    Return in about 4 weeks (around 12/30/2020) for Mood.    This visit occurred during the SARS-CoV-2 public health emergency.  Safety protocols were in place, including screening questions prior to the visit, additional usage of staff PPE, and extensive cleaning of exam room while observing appropriate contact time as indicated for disinfecting solutions.

## 2020-12-03 ENCOUNTER — Ambulatory Visit: Payer: 59 | Admitting: Sports Medicine

## 2021-04-03 ENCOUNTER — Encounter: Payer: Self-pay | Admitting: Medical-Surgical

## 2021-04-03 ENCOUNTER — Ambulatory Visit: Payer: 59 | Admitting: Medical-Surgical

## 2021-04-03 ENCOUNTER — Other Ambulatory Visit: Payer: Self-pay

## 2021-04-03 VITALS — BP 152/96 | HR 71 | Resp 20 | Ht 74.0 in | Wt 338.4 lb

## 2021-04-03 DIAGNOSIS — F418 Other specified anxiety disorders: Secondary | ICD-10-CM

## 2021-04-03 DIAGNOSIS — I10 Essential (primary) hypertension: Secondary | ICD-10-CM | POA: Diagnosis not present

## 2021-04-03 DIAGNOSIS — R7989 Other specified abnormal findings of blood chemistry: Secondary | ICD-10-CM

## 2021-04-03 DIAGNOSIS — Z7689 Persons encountering health services in other specified circumstances: Secondary | ICD-10-CM | POA: Diagnosis not present

## 2021-04-03 DIAGNOSIS — Z131 Encounter for screening for diabetes mellitus: Secondary | ICD-10-CM

## 2021-04-03 DIAGNOSIS — R748 Abnormal levels of other serum enzymes: Secondary | ICD-10-CM

## 2021-04-03 DIAGNOSIS — R4189 Other symptoms and signs involving cognitive functions and awareness: Secondary | ICD-10-CM

## 2021-04-03 DIAGNOSIS — E785 Hyperlipidemia, unspecified: Secondary | ICD-10-CM

## 2021-04-03 DIAGNOSIS — Z1329 Encounter for screening for other suspected endocrine disorder: Secondary | ICD-10-CM

## 2021-04-03 MED ORDER — AMITRIPTYLINE HCL 25 MG PO TABS: 12.5000 mg | ORAL_TABLET | Freq: Every day | ORAL | 0 refills | Status: DC

## 2021-04-03 NOTE — Progress Notes (Signed)
HPI with pertinent ROS:   CC: Transfer of care  HPI: Pleasant 41 year old male presenting today to transfer care to new PCP And for the following:  Would like to have his testosterone checked.  He has significant issues with brain fog and often forgets names and phone numbers which are big part of his job.  Over the past 12 months, this seems to have gotten worse.  Some days he seems to be okay and others are definitely not.  His relative has similar issues and was diagnosed with low testosterone.  Chronic issues with difficulty sleeping.  Currently using melatonin 10 mg nightly but this seems to be more hit or miss depending on the situation.  He does have sleep apnea and wears a CPAP but has issues with falling asleep using it.  If he puts it on too early, he was unable to sleep at all.  He has to wait until he is in the process of nodding off to put the CPAP on so that he sleeps well.  Once he falls asleep, he is able to stay asleep without difficulty.  He did try Seroquel in the past but it made him groggy.  He also stays groggy when using Benadryl.  He is interested in a medication to help cannot function if the medication is too strong.  Mood-history of anxiety and depression previously treated with Lexapro 10 mg daily.  Notes that he tolerated the medication well however it made him apathetic and he lost interest in concerning and even his favorite activities.  He stopped the medication on his own and has not been taking anything since then.  See below for his PHQ9/GAD 7 scores.  I reviewed the past medical history, family history, social history, surgical history, and allergies today and no changes were needed.  Please see the problem list section below in epic for further details.  Depression screen [facility] 2/9 04/03/2021 12/02/2020 11/26/2016  Decreased Interest 3 1 2   Down, Depressed, Hopeless 1 3 2   PHQ - 2 Score 4 4 4   Altered sleeping 3 3 3   Tired, decreased energy 1 2 1   Change in  appetite 2 3 2   Feeling bad or failure about yourself  2 2 1   Trouble concentrating 3 3 2   Moving slowly or fidgety/restless 3 2 0  Suicidal thoughts 0 0 0  PHQ-9 Score 18 19 13   Difficult doing work/chores Somewhat difficult Somewhat difficult -   GAD 7 : Generalized Anxiety Score 04/03/2021 12/02/2020  Nervous, Anxious, on Edge 1 2  Control/stop worrying 1 2  Worry too much - different things 1 2  Trouble relaxing 0 2  Restless 0 1  Easily annoyed or irritable 2 3  Afraid - awful might happen 2 3  Total GAD 7 Score 7 15  Anxiety Difficulty Somewhat difficult Somewhat difficult   Physical exam:   General: Well Developed, well nourished, and in no acute distress.  Neuro: Alert and oriented x3.  HEENT: Normocephalic, atraumatic.  Skin: Warm and dry. Cardiac: Regular rate and rhythm, no murmurs rubs or gallops, no lower extremity edema.  Respiratory: Clear to auscultation bilaterally. Not using accessory muscles, speaking in full sentences.  Impression and Recommendations:    1. Encounter to establish care Reviewed available information and discussed care concerns with patient.   2. Essential hypertension Checking labs as below.  Blood pressure is elevated today on recheck but was near goal on arrival at 135/85.  Recommend monitoring at home with a  goal of 130/80 or less.  If consistently elevated, may need to consider restarting medications.  Limit dietary sodium to no more than 2 g/day.  Recommend regular exercise and weight loss to a healthy weight. - Lipid panel - COMPLETE METABOLIC PANEL WITH GFR - CBC with Differential/Platelet  3. Depression with anxiety This does not appear to have responded well to SSRIs in the past however the dose may have been just a little too much for him.  Discussed various options for management.  We will going check testosterone to see if this is contributing.  Adding amitriptyline 25 mg nightly. - Testosterone  4. Mild  hyperlipidemia Checking lipid panel today. - Lipid panel  5. Thyroid disorder screen Checking TSH today. - TSH  6. Diabetes mellitus screening Checking hemoglobin A1c today. - Hemoglobin A1c  7. Brain fog Checking testosterone today. - Testosterone  Return in about 4 weeks (around 05/01/2021) for insomnia follow up. ___________________________________________ Clearnce Sorrel, DNP, APRN, FNP-BC [facility] and [place]

## 2021-04-04 LAB — COMPLETE METABOLIC PANEL WITH GFR
AG Ratio: 1.5 (calc) (ref 1.0–2.5)
ALT: 81 U/L — ABNORMAL HIGH (ref 9–46)
AST: 54 U/L — ABNORMAL HIGH (ref 10–40)
Albumin: 4.4 g/dL (ref 3.6–5.1)
Alkaline phosphatase (APISO): 55 U/L (ref 36–130)
BUN: 15 mg/dL (ref 7–25)
CO2: 28 mmol/L (ref 20–32)
Calcium: 9.3 mg/dL (ref 8.6–10.3)
Chloride: 103 mmol/L (ref 98–110)
Creat: 1.09 mg/dL (ref 0.60–1.29)
Globulin: 2.9 g/dL (calc) (ref 1.9–3.7)
Glucose, Bld: 82 mg/dL (ref 65–99)
Potassium: 4.4 mmol/L (ref 3.5–5.3)
Sodium: 140 mmol/L (ref 135–146)
Total Bilirubin: 1.3 mg/dL — ABNORMAL HIGH (ref 0.2–1.2)
Total Protein: 7.3 g/dL (ref 6.1–8.1)
eGFR: 88 mL/min/{1.73_m2} (ref >=60)

## 2021-04-04 LAB — CBC WITH DIFFERENTIAL/PLATELET
Absolute Monocytes: 980 cells/uL — ABNORMAL HIGH (ref 200–950)
Basophils Absolute: 63 cells/uL (ref 0–200)
Basophils Relative: 0.9 %
Eosinophils Absolute: 252 cells/uL (ref 15–500)
Eosinophils Relative: 3.6 %
HCT: 50 % (ref 38.5–50.0)
Hemoglobin: 17.3 g/dL — ABNORMAL HIGH (ref 13.2–17.1)
Lymphs Abs: 2492 cells/uL (ref 850–3900)
MCH: 31.3 pg (ref 27.0–33.0)
MCHC: 34.6 g/dL (ref 32.0–36.0)
MCV: 90.4 fL (ref 80.0–100.0)
MPV: 9.6 fL (ref 7.5–12.5)
Monocytes Relative: 14 %
Neutro Abs: 3213 cells/uL (ref 1500–7800)
Neutrophils Relative %: 45.9 %
Platelets: 251 10*3/uL (ref 140–400)
RBC: 5.53 10*6/uL (ref 4.20–5.80)
RDW: 12.6 % (ref 11.0–15.0)
Total Lymphocyte: 35.6 %
WBC: 7 10*3/uL (ref 3.8–10.8)

## 2021-04-04 LAB — HEMOGLOBIN A1C
Hgb A1c MFr Bld: 5.4 % of total Hgb (ref <5.7)
Mean Plasma Glucose: 108 mg/dL
eAG (mmol/L): 6 mmol/L

## 2021-04-04 LAB — LIPID PANEL
Cholesterol: 192 mg/dL (ref <200)
HDL: 28 mg/dL — ABNORMAL LOW (ref >=40)
LDL Cholesterol (Calc): 134 mg/dL (calc) — ABNORMAL HIGH
Non-HDL Cholesterol (Calc): 164 mg/dL (calc) — ABNORMAL HIGH (ref <130)
Total CHOL/HDL Ratio: 6.9 (calc) — ABNORMAL HIGH (ref <5.0)
Triglycerides: 166 mg/dL — ABNORMAL HIGH (ref <150)

## 2021-04-04 LAB — TESTOSTERONE: Testosterone: 214 ng/dL — ABNORMAL LOW (ref 250–827)

## 2021-04-04 LAB — TSH: TSH: 2.66 mIU/L (ref 0.40–4.50)

## 2021-04-04 NOTE — Addendum Note (Signed)
Addended bySamuel Bouche on: 04/04/2021 07:44 AM   Modules accepted: Orders

## 2021-05-01 ENCOUNTER — Encounter: Payer: Self-pay | Admitting: Medical-Surgical

## 2021-05-01 ENCOUNTER — Telehealth: Payer: 59 | Admitting: Medical-Surgical

## 2021-05-01 VITALS — Wt 330.0 lb

## 2021-05-01 DIAGNOSIS — F418 Other specified anxiety disorders: Secondary | ICD-10-CM

## 2021-05-01 DIAGNOSIS — F5101 Primary insomnia: Secondary | ICD-10-CM

## 2021-05-01 MED ORDER — BELSOMRA 15 MG PO TABS: 15.0000 mg | ORAL_TABLET | Freq: Every evening | ORAL | 0 refills | Status: DC | PRN

## 2021-05-01 MED ORDER — FLUOXETINE HCL 20 MG PO CAPS: 20.0000 mg | ORAL_CAPSULE | Freq: Every day | ORAL | 0 refills | Status: DC

## 2021-05-01 NOTE — Progress Notes (Signed)
Virtual Visit via Telephone ?  ?I connected with  Wesley Pacheco  on 05/01/21 by telephone/telehealth and verified that I am speaking with the correct person using two identifiers. ?  ?I discussed the limitations, risks, security and privacy concerns of performing an evaluation and management service by telephone, including the higher likelihood of inaccurate diagnosis and treatment, and the availability of in person appointments.  We also discussed the likely need of an additional face to face encounter for complete and high quality delivery of care.  I also discussed with the patient that there may be a patient responsible charge related to this service. The patient expressed understanding and wishes to proceed. ? ?Provider location is in medical facility. ?Patient location is at their home, different from provider location. ?People involved in care of the patient during this telehealth encounter were myself, my nurse/medical assistant, and my front office/scheduling team member. ? ?CC: Mood/sleep follow-up ? ?HPI: ?Pleasant 41 year old male presenting via telephone for mood and insomnia follow-up.  Approximately 4 weeks ago, we had switched him to amitriptyline 25 mg at night with an instruction to increase to 50 mg after 1 week.  He notes that he did try the medication at 25 mg the first night but it did nothing to help him sleep.  The second night he decided to go ahead and increase it to 50 mg but that also did nothing.  After taking it for about 3 nights, he stopped the medication Because he felt like it was not working.  He continues to have significant difficulty with depressive symptoms but is unsure how to quantify them because he also is only getting about 3 hours of sleep per night.  He feels that the significant lack of sleep is exacerbating his mood concerns and that both need to be addressed in order to get relief.  He is open to trying other agents.  To date, he has not tolerated Lexapro (apathy) or  Seroquel (grogginess). ? ?Review of Systems: See HPI for pertinent positives and negatives.  ? ?Objective Findings:   ? ?General: Speaking full sentences, no audible heavy breathing.  Sounds alert and appropriately interactive.   ? ?Impression and Recommendations:   ? ?1. Primary insomnia ?2. Depression with anxiety ?Discontinue Amitriptyline. Feel this may have been a good option but it did not have time to get in his system and reach therapeutic levels. Start Bellsomra '15mg'$  nightly prn for insomnia. Start Fluoxetine '20mg'$  daily. MyChart message explaining prescription recommendations, dosing, and expectations for effectiveness sent.  ? ?I discussed the above assessment and treatment plan with the patient. The patient was provided an opportunity to ask questions and all were answered. The patient agreed with the plan and demonstrated an understanding of the instructions. ?  ?The patient was advised to call back or seek an in-person evaluation if the symptoms worsen or if the condition fails to improve as anticipated. ? ?25 minutes of non-face-to-face time was provided during this encounter. ? ?Return in about 6 weeks (around 06/12/2021) for mood/insomnia follow up. ?___________________________________________ ?Samuel Bouche, DNP, APRN, FNP-BC ?Primary Care and Sports Medicine ?[facility] ? ?

## 2021-05-06 LAB — HEPATIC FUNCTION PANEL
AG Ratio: 1.7 (calc) (ref 1.0–2.5)
ALT: 83 U/L — ABNORMAL HIGH (ref 9–46)
AST: 44 U/L — ABNORMAL HIGH (ref 10–40)
Albumin: 4.5 g/dL (ref 3.6–5.1)
Alkaline phosphatase (APISO): 65 U/L (ref 36–130)
Bilirubin, Direct: 0.1 mg/dL (ref 0.0–0.2)
Globulin: 2.7 g/dL (calc) (ref 1.9–3.7)
Indirect Bilirubin: 0.7 mg/dL (calc) (ref 0.2–1.2)
Total Bilirubin: 0.8 mg/dL (ref 0.2–1.2)
Total Protein: 7.2 g/dL (ref 6.1–8.1)

## 2021-05-06 LAB — TESTOSTERONE TOTAL,FREE,BIO, MALES
Albumin: 4.5 g/dL (ref 3.6–5.1)
Sex Hormone Binding: 29 nmol/L (ref 10–50)
Testosterone, Bioavailable: 78.2 ng/dL — ABNORMAL LOW (ref 110.0–575.0)
Testosterone, Free: 38 pg/mL — ABNORMAL LOW (ref 46.0–224.0)
Testosterone: 273 ng/dL (ref 250–827)

## 2021-05-06 LAB — FSH/LH
FSH: 4.3 m[IU]/mL (ref 1.6–8.0)
LH: 5.4 m[IU]/mL (ref 1.5–9.3)

## 2021-05-08 ENCOUNTER — Telehealth: Payer: Self-pay

## 2021-05-08 NOTE — Telephone Encounter (Addendum)
Initiated Prior authorization JOI:NOMVEHMCNO HCl '20MG'$  capsules ?Via: Covermymeds ?Case/Key: [id number] ?Status: approved  as of 05/08/21 ?Reason: ?Notified Pt via: Mychart ?

## 2022-01-25 NOTE — Progress Notes (Unsigned)
Virtual Visit via Video Note  I connected with Wesley Pacheco on 01/26/22 at  1:00 PM EST by a video enabled telemedicine application and verified that I am speaking with the correct person using two identifiers.   I discussed the limitations of evaluation and management by telemedicine and the availability of in person appointments. The patient expressed understanding and agreed to proceed.  Patient location: home Provider locations: office  Subjective:    CC: Wants testosterone checked  HPI: Pleasant 41 year old male presenting via [place] video visit with request for rechecking testosterone.  Earlier this year, we checked his testosterone with a result of 214 which was below the normal range.  On recheck few weeks later, his result was up to 273.  He was experiencing brain fog, forgetfulness, fatigue, and low libido.  Notes that he has been working on dietary changes, regular intentional exercise, and weight loss since then but his symptoms have remained unchanged.  He would like to have his testosterone rechecked and if still on the lower side of things, would be interested in starting testosterone replacement.  He has done research and would like to try testosterone injections if needed.   Past medical history, Surgical history, Family history not pertinant except as noted below, Social history, Allergies, and medications have been entered into the medical record, reviewed, and corrections made.   Review of Systems: See HPI for pertinent positives and negatives.   Objective:    General: Speaking clearly in complete sentences without any shortness of breath.  Alert and oriented x3.  Normal judgment. No apparent acute distress.  Impression and Recommendations:    1. Low testosterone Discussed various symptoms.  Rechecking testosterone.  If testosterone remains low, we will look to replace with IM testosterone injections.  Would like him to come in the office for blood pressure  monitoring and education on self injection for the first couple of doses.  If comfortable with it at that time, he can start doing testosterone injections at home.  Patient verbalized understanding is agreeable to the plan. - Testosterone Total,Free,Bio, Males  I discussed the assessment and treatment plan with the patient. The patient was provided an opportunity to ask questions and all were answered. The patient agreed with the plan and demonstrated an understanding of the instructions.   The patient was advised to call back or seek an in-person evaluation if the symptoms worsen or if the condition fails to improve as anticipated.  25 minutes of non-face-to-face time was provided during this encounter.  Return if symptoms worsen or fail to improve.  Clearnce Sorrel, DNP, APRN, FNP-BC [facility]

## 2022-01-26 ENCOUNTER — Encounter: Payer: Self-pay | Admitting: Medical-Surgical

## 2022-01-26 ENCOUNTER — Telehealth (INDEPENDENT_AMBULATORY_CARE_PROVIDER_SITE_OTHER): Payer: 59 | Admitting: Medical-Surgical

## 2022-01-26 DIAGNOSIS — R7989 Other specified abnormal findings of blood chemistry: Secondary | ICD-10-CM | POA: Diagnosis not present

## 2022-01-29 LAB — TESTOSTERONE TOTAL,FREE,BIO, MALES
Albumin: 4.7 g/dL (ref 3.6–5.1)
Sex Hormone Binding: 35 nmol/L (ref 10–50)
Testosterone: 244 ng/dL — ABNORMAL LOW (ref 250–827)

## 2022-02-02 ENCOUNTER — Ambulatory Visit (INDEPENDENT_AMBULATORY_CARE_PROVIDER_SITE_OTHER): Payer: 59 | Admitting: Medical-Surgical

## 2022-02-02 VITALS — BP 141/89 | HR 66 | Ht 74.0 in | Wt 338.0 lb

## 2022-02-02 DIAGNOSIS — R7989 Other specified abnormal findings of blood chemistry: Secondary | ICD-10-CM | POA: Diagnosis not present

## 2022-02-02 MED ORDER — AMLODIPINE BESYLATE 5 MG PO TABS: 5.0000 mg | ORAL_TABLET | Freq: Every day | ORAL | 1 refills | Status: DC

## 2022-02-02 MED ORDER — TESTOSTERONE CYPIONATE 200 MG/ML IM SOLN
200.0000 mg | INTRAMUSCULAR | Status: AC
  Administered 2022-02-02: 200 mg via INTRAMUSCULAR

## 2022-02-02 NOTE — Progress Notes (Signed)
   Subjective:    Patient ID: Wesley Pacheco, male    DOB: 1980-05-10, 41 y.o.   MRN: [id number]  HPI Pt here for testosterone injection. Denies chest pain, sob,headache and problems with with medication or mood swings.    Review of Systems     Objective:   Physical Exam        Assessment & Plan:   Pt tolerated injection well URQ, pt denies chest pain, sob, headache, and problems with medication and any mood changes. Pt advised to schedule next injection in 14 days.

## 2022-02-02 NOTE — Progress Notes (Signed)
Blood pressure significantly elevated on arrival and again on recheck.  Starting amlodipine 5 mg daily.  Recommend checking blood pressure at home with a goal of 130/80 or less.  If consistently elevated, we will need to make further changes.  Follow-up in 2 weeks for nurse visit for blood pressure check.  If blood pressure is better managed, continue with testosterone injections.   ___________________________________________ Clearnce Sorrel, DNP, APRN, FNP-BC Primary [facility] Sports Medicine [place]

## 2022-02-05 NOTE — Progress Notes (Signed)
Patient advised.

## 2022-10-12 ENCOUNTER — Encounter: Payer: Self-pay | Admitting: Medical-Surgical

## 2022-10-12 ENCOUNTER — Ambulatory Visit (INDEPENDENT_AMBULATORY_CARE_PROVIDER_SITE_OTHER): Payer: 59

## 2022-10-12 ENCOUNTER — Ambulatory Visit: Payer: 59 | Admitting: Medical-Surgical

## 2022-10-12 VITALS — BP 138/84 | HR 77 | Resp 20 | Ht 74.0 in | Wt 342.2 lb

## 2022-10-12 DIAGNOSIS — M25561 Pain in right knee: Secondary | ICD-10-CM | POA: Diagnosis not present

## 2022-10-12 DIAGNOSIS — G8929 Other chronic pain: Secondary | ICD-10-CM

## 2022-10-12 DIAGNOSIS — I10 Essential (primary) hypertension: Secondary | ICD-10-CM | POA: Diagnosis not present

## 2022-10-12 DIAGNOSIS — R7989 Other specified abnormal findings of blood chemistry: Secondary | ICD-10-CM | POA: Diagnosis not present

## 2022-10-12 DIAGNOSIS — G4733 Obstructive sleep apnea (adult) (pediatric): Secondary | ICD-10-CM

## 2022-10-12 MED ORDER — VALSARTAN 40 MG PO TABS: 40.0000 mg | ORAL_TABLET | Freq: Every day | ORAL | 3 refills | Status: AC

## 2022-10-12 NOTE — Progress Notes (Signed)
        Established patient visit  History, exam, impression, and plan:  1. Obstructive sleep apnea Pleasant 42 year old male presenting today with a history of OSA. Has a CPAP and was trying to use it nightly but his wife still reports that he snores even with the machine. His machine is old and he would like to get a new one. Unable to see the last sleep study in the system but he reports that it was done about 3 years ago. Ordering CPAP titration study.  - Cpap titration; Future  2. Primary hypertension History of elevated blood pressure. In December, was started on Amlodipine 5mg  daily. He did not tolerate this medication well due to lower extremity edema and has not been taking anything since then. Has a wrist cuff at home but not checking regularly. Took Lisinopril and Enalapril in the past but reports these were not well tolerated either. Denies CP, SOB, palpitations, lower extremity edema, dizziness, headaches, or vision changes. BP still mildly elevated today and he is interested in restarting testosterone. Starting Valsartan 40mg  daily. Recommend obtaining a BP cuff that measures on the upper arm to monitor at home with a goal of 130/80 or less.  - valsartan (DIOVAN) 40 MG tablet; Take 1 tablet (40 mg total) by mouth daily.  Dispense: 90 tablet; Refill: 3  3. Chronic pain of right knee History of right knee injury years ago. Having increased pain in the knee that has lingered for the past month.  Has had to have fluid drained off the knee several times in the past.  Today, reports that the pain is worse when putting weight on the joint and when removing the weight.  Plan to get x-rays today.  For now, treat with conservative measures including ice, rest, compression, heat, and anti-inflammatories as needed. - DG Knee Complete 4 Views Right; Future  4. Low testosterone Initiated testosterone injections back in December however with the elevated blood pressure, these were discontinued until  it was better controlled.  He was lost to follow-up at that time.  Today, he is still interested in resuming testosterone injections.  Explained that we will need to have better blood pressure control to resume the medication.  Starting valsartan as noted above.  Return in 2 weeks for nurse visit for blood pressure check.  If blood pressure looks better and is at goal, restart testosterone injections.  Procedures performed this visit: None.  Return in about 2 weeks (around 10/26/2022) for nurse visit for BP check.  __________________________________ Thayer Ohm, DNP, APRN, FNP-BC Primary Care and Sports Medicine [facility] [place] [facility]

## 2022-10-14 ENCOUNTER — Encounter: Payer: Self-pay | Admitting: Medical-Surgical

## 2022-10-26 ENCOUNTER — Ambulatory Visit: Payer: 59

## 2022-12-23 ENCOUNTER — Telehealth: Payer: Self-pay | Admitting: Medical-Surgical

## 2022-12-23 DIAGNOSIS — G4733 Obstructive sleep apnea (adult) (pediatric): Secondary | ICD-10-CM

## 2022-12-23 NOTE — Telephone Encounter (Signed)
Received incoming voicemail from patients wife(Amber) requesting to check status of sleep study referral. Pt wife states previous CPAP machine was recalled and patient has not been currently using machine. Do you want to re-order home sleep study or refer to sleep specialist? Please advise.

## 2022-12-23 NOTE — Telephone Encounter (Signed)
Patient was ordered to have a CPAP titration study as he already has known sleep apnea and is using a CPAP.  The order was placed however I do not see any follow-up or information regarding scheduling.  Could you reach out to see if there is some sort of delay or anything I need to do on my end to get this expedited? Thanks, Ander Slade

## 2022-12-24 NOTE — Telephone Encounter (Signed)
Orders for CPAP titration study was canceled in the system. Can you reorder study? Does this study need to be completed at [facility] or [facility]?

## 2022-12-24 NOTE — Telephone Encounter (Signed)
Referral and clinical notes have been faxed to [facility] through Epic. Office will contact patient to schedule referral appointment.

## 2022-12-24 NOTE — Telephone Encounter (Signed)
12/24/2022-Left message on patients wife Sport and exercise psychologist) voicemail updating her on referral status.

## 2022-12-31 ENCOUNTER — Ambulatory Visit (INDEPENDENT_AMBULATORY_CARE_PROVIDER_SITE_OTHER): Payer: 59 | Admitting: Neurology

## 2022-12-31 ENCOUNTER — Encounter: Payer: Self-pay | Admitting: Neurology

## 2022-12-31 VITALS — BP 171/89 | HR 80 | Ht 74.0 in | Wt 345.4 lb

## 2022-12-31 DIAGNOSIS — G4733 Obstructive sleep apnea (adult) (pediatric): Secondary | ICD-10-CM | POA: Diagnosis not present

## 2022-12-31 DIAGNOSIS — R030 Elevated blood-pressure reading, without diagnosis of hypertension: Secondary | ICD-10-CM

## 2022-12-31 DIAGNOSIS — R519 Headache, unspecified: Secondary | ICD-10-CM | POA: Diagnosis not present

## 2022-12-31 DIAGNOSIS — R351 Nocturia: Secondary | ICD-10-CM | POA: Diagnosis not present

## 2022-12-31 DIAGNOSIS — Z820 Family history of epilepsy and other diseases of the nervous system: Secondary | ICD-10-CM

## 2022-12-31 DIAGNOSIS — I499 Cardiac arrhythmia, unspecified: Secondary | ICD-10-CM

## 2022-12-31 DIAGNOSIS — R635 Abnormal weight gain: Secondary | ICD-10-CM

## 2022-12-31 NOTE — Progress Notes (Signed)
Subjective:    Patient ID: Wesley Pacheco is a 42 y.o. male.  HPI    Wesley Foley, MD, PhD [facility] [address], [address] [address] [place], [place] [address]  Dear Wesley Pacheco,  I saw your patient, Wesley Pacheco, upon your kind request in my sleep clinic today for initial consultation of his sleep disorder, in particular, evaluation of his prior diagnosis of obstructive sleep apnea.  The patient is unaccompanied today.  As you know, Wesley Pacheco is a 42 year old male with an underlying medical history of hypertension, low testosterone, right knee pain and severe obesity with a BMI of over 40, who was previously diagnosed with obstructive sleep apnea and placed on PAP therapy.  He is currently not on PAP therapy.  He reports snoring and apneic pauses.  His Epworth sleepiness score is 5 out of 24, fatigue severity score is 13.  Prior sleep study results are not available for my review today.  Sleep testing was several years ago.  In fact, his original sleep study was a laboratory test in [place], this was in or around 2010 or 11, he reports that he was diagnosed with severe sleep apnea at the time.  He had a home sleep test since then, when he moved to [place].  He does not know the exact date from this test but has been on PAP therapy for quite some time, he has not actually used his machine in about 2 years.  His [employer] Respironics machine has been affected by the recall but he never looked into getting a replacement unit.  This set up date on his current machine according to the download is 12/29/2016.  I reviewed compliance data from 11/13/2020 through 02/10/2021 he used his machine sporadically.  Average AHI was at goal at 1/h.  90th percentile of pressure was 15.5 cm.  He reports that he did well with PAP therapy but he has had chronic difficulty initiating and maintaining sleep and when he woke up in the middle of the night it was hard for him to put the machine back on.   He lives with his family, his wife still notices loud snoring and apneic pauses.  She also noticed residual snoring on his machine.  His dad had sleep apnea and he died young at 64 from a massive heart attack.  Patient has had slow weight gain, he estimates that since his original sleep apnea diagnosis he gained about 40 pounds.  He has a CDL.  He currently does not have an active health current however.  His bedtime is generally between midnight and 1 AM and rise time around 6:30 AM.  He has nocturia about once or twice per average night and has rare morning headaches.  He takes melatonin about 10 mg each night for sleep.  He works as a Designer, industrial/product.  He has 2 children, ages 12 and 63.  They have 2 cats in the household which are inside and outside.  The cats do not sleep in his bedroom at night.  He does have a TV in his bedroom but does not have it on at night.  He does not drink caffeine daily.  He drinks alcohol very occasionally, maybe 4 times a month, usually in the form of beer.  He is a non-smoker.  He is working on weight loss.   I reviewed your office note from 10/12/2022.  His Past Medical History Is Significant For: Past Medical History:  Diagnosis Date  Essential hypertension 11/27/2016   Family history of cardiac disorder in father 08/27/2015   Insomnia 08/27/2015   Sleep apnea 11/26/2016   On CPAP, last sleep study approximately 5-7 years ago     His Past Surgical History Is Significant For: History reviewed. No pertinent surgical history.  His Family History Is Significant For: Family History  Problem Relation Age of Onset   Cancer Mother    Hypertension Father    Heart attack Father    Sleep apnea Father     His Social History Is Significant For: Social History   Socioeconomic History   Marital status: Married    Spouse name: Not on file   Number of children: Not on file   Years of education: Not on file   Highest education level: Not on file  Occupational  History   Not on file  Tobacco Use   Smoking status: Never   Smokeless tobacco: Never  Vaping Use   Vaping status: Never Used  Substance and Sexual Activity   Alcohol use: Yes    Comment: occ   Drug use: Not Currently   Sexual activity: Not Currently  Other Topics Concern   Not on file  Social History Narrative   Not on file   Social Determinants of Health   Financial Resource Strain: Not on file  Food Insecurity: No Food Insecurity (03/31/2020)   Received from [facility], [facility]   Hunger Vital Sign    Worried About Running Out of Food in the Last Year: Never true    Ran Out of Food in the Last Year: Never true  Transportation Needs: Not on file  Physical Activity: Not on file  Stress: Not on file  Social Connections: Unknown (07/01/2021)   Received from [facility], [facility]   Social Network    Social Network: Not on file    His Allergies Are:  No Known Allergies:   His Current Medications Are:  Outpatient Encounter Medications as of 12/31/2022  Medication Sig   valsartan (DIOVAN) 40 MG tablet Take 1 tablet (40 mg total) by mouth daily. (Patient not taking: Reported on 12/31/2022)   Facility-Administered Encounter Medications as of 12/31/2022  Medication   testosterone cypionate (DEPOTESTOSTERONE CYPIONATE) injection 200 mg  :   Review of Systems:  Out of a complete 14 point review of systems, all are reviewed and negative with the exception of these symptoms as listed below:   Review of Systems  Neurological:        Pt here for sleep consult  Pt snores,few headaches,hypertension  Pt denies fatigue, . Pt states  HST 2021 and hasn't used pap machine in a year    ESS:5 FSS:13     Objective:  Neurological Exam  Physical Exam Physical Examination:   Vitals:   12/31/22 0900  BP: (!) 171/89  Pulse: 80    General Examination: The patient is a very pleasant 42 y.o. male in no acute distress. He appears well-developed and well-nourished  and well groomed.   HEENT: Normocephalic, atraumatic, pupils are equal, round and reactive to light, extraocular tracking is good without limitation to gaze excursion or nystagmus noted. Hearing is grossly intact. Face is symmetric with normal facial animation. Speech is clear with no dysarthria noted. There is no hypophonia. There is no lip, neck/head, jaw or voice tremor. Neck is supple with full range of passive and active motion. There are no carotid bruits on auscultation. Oropharynx exam reveals: moderate mouth dryness, good dental hygiene  and marked airway crowding, due to small airway entry, tonsillar size of 1-2+, larger uvula.  Mallampati class III.  Neck circumference 21/8 inches.  Tongue protrudes centrally and palate elevates symmetrically, minimal to no overbite.  Chest: Clear to auscultation without wheezing, rhonchi or crackles noted.  Heart: S1+S2+0, slightly irregular with pauses noted, possible frequent PVCs with compensatory pauses, not actually irregularly irregular.  No murmurs.     Abdomen: Soft, non-tender and non-distended.  Extremities: There is no pitting edema in the distal lower extremities bilaterally.   Skin: Warm and dry without trophic changes noted.   Musculoskeletal: exam reveals no obvious joint deformities.   Neurologically:  Mental status: The patient is awake, alert and oriented in all 4 spheres. His immediate and remote memory, attention, language skills and fund of knowledge are appropriate. There is no evidence of aphasia, agnosia, apraxia or anomia. Speech is clear with normal prosody and enunciation. Thought process is linear. Mood is normal and affect is normal.  Cranial nerves II - XII are as described above under HEENT exam.  Motor exam: Normal bulk, strength and tone is noted. There is no obvious action or resting tremor.  Fine motor skills and coordination: grossly intact.  Cerebellar testing: No dysmetria or intention tremor. There is no truncal  or gait ataxia.  Sensory exam: intact to light touch in the upper and lower extremities.  Gait, station and balance: He stands easily. No veering to one side is noted. No leaning to one side is noted. Posture is age-appropriate and stance is narrow based. Gait shows normal stride length and normal pace. No problems turning are noted.   Assessment and Plan:  In summary, Wesley Pacheco is a very pleasant 42 y.o.-year old male with an underlying medical history of hypertension, low testosterone, right knee pain and severe obesity with a BMI of over 40, who presents for evaluation of his obstructive sleep apnea.  He was diagnosed with severe obstructive sleep apnea several years ago, he is currently no longer on PAP therapy.  He had an AutoPap machine which was a [employer] but has not received a replacement machine after the recall.  He has not had sleep testing in many years.  We do not have prior data available.  He also reports significant weight gain over time.   I had a long chat with the patient about my findings and the diagnosis of sleep apnea, particularly OSA, its prognosis and treatment options. We talked about medical/conservative treatments, surgical interventions and non-pharmacological approaches for symptom control. I explained, in particular, the risks and ramifications of untreated moderate to severe OSA, especially with respect to developing cardiovascular disease down the road, including congestive heart failure (CHF), difficult to treat hypertension, cardiac arrhythmias (particularly A-fib), neurovascular complications including TIA, stroke and dementia. Even type 2 diabetes has, in part, been linked to untreated OSA. Symptoms of untreated OSA may include (but may not be limited to) daytime sleepiness, nocturia (i.e. frequent nighttime urination), memory problems, mood irritability and suboptimally controlled or worsening mood disorder such as depression and/or anxiety, lack  of energy, lack of motivation, physical discomfort, as well as recurrent headaches, especially morning or nocturnal headaches. We talked about the importance of maintaining a healthy lifestyle and striving for healthy weight. In addition, we talked about the importance of striving for and maintaining good sleep hygiene. I recommended a sleep study at this time. I outlined the differences between a laboratory attended sleep study which is considered more comprehensive and  accurate over the option of a home sleep test (HST); the latter may lead to underestimation of sleep disordered breathing in some instances and does not help with diagnosing upper airway resistance syndrome and is not accurate enough to diagnose primary central sleep apnea typically.  We mutually agreed to proceed with a home sleep test at this time. I outlined possible surgical and non-surgical treatment options of OSA, including the use of a positive airway pressure (PAP) device (i.e. CPAP, AutoPAP/APAP or BiPAP in certain circumstances), a custom-made dental device (aka oral appliance, which would require a referral to a specialist dentist or orthodontist typically, and is generally speaking not considered for patients with full dentures or edentulous state), upper airway surgical options, such as traditional UPPP (which is not considered a first-line treatment) or the Inspire device (hypoglossal nerve stimulator, which would involve a referral for consultation with an ENT surgeon, after careful selection, following inclusion criteria - also not first-line treatment). I explained the PAP treatment option to the patient in detail, as this is generally considered first-line treatment.  The patient indicated that he would be willing to try PAP therapy again, if the need arises. I explained the importance of being compliant with PAP treatment, not only for insurance purposes but primarily to improve patient's symptoms symptoms, and for the  patient's long term health benefit, including to reduce His cardiovascular risks longer-term.    He is advised to talk to you about an EKG.  I noticed a irregular heartbeat today, could be secondary to frequent PVCs, at times he had a pause after every second beat.  He is advised to talk to your office about getting a formal EKG done.  His blood pressure is elevated today.  He may benefit from consultation with cardiology eventually.  We will pick up our discussion about the next steps and treatment options after testing.  We will keep him posted as to the test results by phone call and/or MyChart messaging where possible.  We will plan to follow-up in sleep clinic accordingly as well.  I answered all his questions today and the patient was in agreement.   I encouraged him to call with any interim questions, concerns, problems or updates or email [place] through MyChart.  Generally speaking, sleep test authorizations may take up to 2 weeks, sometimes less, sometimes longer, the patient is encouraged to get in touch with [place] if they do not hear back from the sleep lab staff directly within the next 2 weeks.  Thank you very much for allowing me to participate in the care of this nice patient. If I can be of any further assistance to you please do not hesitate to call me at [phone].  Sincerely,   Wesley Foley, MD, PhD

## 2022-12-31 NOTE — Patient Instructions (Signed)
Thank you for choosing [facility] for your sleep related care! It was nice to meet you today! I appreciate that you entrust me with your sleep related healthcare concerns. I hope, I was able to address at least some of your concerns today, and that I can help you feel reassured and also get better.    Here is what we discussed today and what we came up with as our plan for you:   I would like for you to circle back to your primary care regarding your heartbeat.  It was slightly irregular, you may have what we call PVCs which are extra beats generally considered fairly benign.  Nevertheless, I would like to see if you can get a formal EKG through your PCP and further workup or consultation with cardiology as needed.   Based on your symptoms and your exam I believe you are still at risk for obstructive sleep apnea and would benefit from reevaluation as it has been many years and you need new supplies and updated machine. Therefore, I think we should proceed with a sleep study to determine how severe your sleep apnea is. If you have more than mild OSA, I want you to consider ongoing treatment with CPAP. Please remember, the risks and ramifications of moderate to severe obstructive sleep apnea or OSA are: Cardiovascular disease, including congestive heart failure, stroke, difficult to control hypertension, arrhythmias, and even type 2 diabetes has been linked to untreated OSA. Sleep apnea causes disruption of sleep and sleep deprivation in most cases, which, in turn, can cause recurrent headaches, problems with memory, mood, concentration, focus, and vigilance. Most people with untreated sleep apnea report excessive daytime sleepiness, which can affect their ability to drive. Please do not drive if you feel sleepy.   I will likely see you back after your sleep study to go over the test results and where to go from there. We will call you after your sleep study to advise about the results  (most likely, you will hear from [place], my nurse) and to set up an appointment at the time, as necessary.    Our sleep lab administrative assistant will call you to schedule your sleep study. If you don't hear back from her by about 2 weeks from now, please feel free to call her at [phone]. You can leave a message with your phone number and concerns, if you get the voicemail box. She will call back as soon as possible.

## 2023-01-13 ENCOUNTER — Ambulatory Visit: Payer: 59 | Admitting: Neurology

## 2023-01-13 DIAGNOSIS — R030 Elevated blood-pressure reading, without diagnosis of hypertension: Secondary | ICD-10-CM

## 2023-01-13 DIAGNOSIS — Z820 Family history of epilepsy and other diseases of the nervous system: Secondary | ICD-10-CM

## 2023-01-13 DIAGNOSIS — R351 Nocturia: Secondary | ICD-10-CM

## 2023-01-13 DIAGNOSIS — R635 Abnormal weight gain: Secondary | ICD-10-CM

## 2023-01-13 DIAGNOSIS — G4733 Obstructive sleep apnea (adult) (pediatric): Secondary | ICD-10-CM | POA: Diagnosis not present

## 2023-01-13 DIAGNOSIS — R519 Headache, unspecified: Secondary | ICD-10-CM

## 2023-01-13 DIAGNOSIS — G4734 Idiopathic sleep related nonobstructive alveolar hypoventilation: Secondary | ICD-10-CM

## 2023-01-13 DIAGNOSIS — I499 Cardiac arrhythmia, unspecified: Secondary | ICD-10-CM

## 2023-01-26 NOTE — Procedures (Signed)
[facility]  HOME SLEEP TEST (SANSA) REPORT (Mail-Out Device):   STUDY DATE: 01/17/23  DOB: 08-02-1980  MRN: [id number]  ORDERING CLINICIAN: [name], MD, PhD   REFERRING CLINICIAN: Christen Butter, NP   CLINICAL INFORMATION/HISTORY: 42 year old male with an underlying medical history of hypertension, low testosterone, right knee pain and severe obesity with a BMI of over 40, who was previously diagnosed with obstructive sleep apnea and placed on PAP therapy. He is currently not on PAP therapy. He reports snoring and apneic pauses.   PATIENT'S LAST REPORTED EPWORTH SLEEPINESS SCORE (ESS): 5/24.  BMI (at the time of sleep clinic visit and/or test date): 44.4 kg/m  FINDINGS:   Study Protocol:    The SANSA single-point-of-skin-contact chest-worn sensor - an FDA and DOT approved type 4 home sleep test device - measures eight physiological channels,  including blood oxygen saturation (measured via PPG [photoplethysmography]), EKG-derived heart rate, respiratory effort, chest movement (measured via accelerometer), snoring, body position, and actigraphy. The device is designed to be worn for up to 10 hours per study.   Sleep Summary:   Total Recording Time (hours, min): 6 hours, 36 min  Total Sleep Time (hours, min):  5 hours, 15 min  Sleep Efficiency (%):    80%   Respiratory Indices:   Calculated sAHI (per hour):  70.1/hour         Oxygen Saturation Statistics:    Oxygen Saturation (%) Mean: 92%   Minimum oxygen saturation (%):                 63.7%   O2 Saturation Range (%): 63.7 - 99.8%    Pulse Rate Statistics:   Pulse Mean (bpm):    63/min    Pulse Range (52 - 102/min)   Snoring: mild to moderate   IMPRESSION/DIAGNOSES:   OSA (obstructive sleep apnea), severe  Nocturnal Hypoxemia  RECOMMENDATIONS:   This home sleep test demonstrates severe obstructive sleep apnea with a total AHI of 70.1/hour and O2 nadir of 63.7% with significant time  below or at 88% saturation of over 120 minutes for the study, indicating nocturnal hypoxemia. Snoring was detected, in the mild to moderate range. Urgent treatment with positive airway pressure is highly recommended. The patient will be advised to proceed with an autoPAP titration/trial at home. A laboratory attended titration study can be considered in the future for optimization of treatment settings and to improve tolerance and compliance, if needed, down the road. Alternative treatment options are limited secondary to the severity of the patient's sleep disordered breathing, but may include surgical treatment with an implantable hypoglossal nerve stimulator (in carefully selected candidates, meeting criteria).  Concomitant weight loss is recommended (where clinically appropriate). Please note, that untreated obstructive sleep apnea may carry additional perioperative morbidity. Patients with significant obstructive sleep apnea should receive perioperative PAP therapy and the surgeons and particularly the anesthesiologist should be informed of the diagnosis and the severity of the sleep disordered breathing. The patient should be cautioned not to drive, work at heights, or operate dangerous or heavy equipment when tired or sleepy. Review and reiteration of good sleep hygiene measures should be pursued with any patient. Other causes of the patient's symptoms, including circadian rhythm disturbances, an underlying mood disorder, medication effect and/or an underlying medical problem cannot be ruled out based on this test. Clinical correlation is recommended.  The patient and his referring provider will be notified of the test results. The patient will be seen in follow up in sleep  clinic at [facility].  I certify that I have reviewed the raw data recording prior to the issuance of this report in accordance with the standards of the American Academy of Sleep Medicine (AASM).   INTERPRETING PHYSICIAN:   [name], MD, PhD Medical Director, [facility]) Diplomat, ABPN (Neurology and Sleep)   [facility] [address] [place], [place] [address] [phone]

## 2023-01-26 NOTE — Progress Notes (Signed)
See procedure note.

## 2023-01-26 NOTE — Addendum Note (Signed)
Addended by: [name] on: 01/26/2023 05:39 PM   Modules accepted: Orders

## 2023-01-27 ENCOUNTER — Telehealth: Payer: Self-pay | Admitting: *Deleted

## 2023-01-27 NOTE — Telephone Encounter (Signed)
-----   Message from [name] sent at 01/26/2023  5:39 PM EST ----- Urgent set up requested on PAP therapy, due to severe OSA.  Patient referred by PCP for re-eval of OSA, he is currently not using his autoPAP. He was seen by me on 12/31/22, patient had a HST on 01/17/23.    Please call and notify the patient that the recent home sleep test showed obstructive sleep apnea in the severe range. I recommend treatment for this in the form of autoPAP, which means, that we don't have to bring him in for a sleep study with CPAP, but will let him start using a so called autoPAP machine at home, through a DME company (of his choice, or as per insurance requirement). The DME representative will fit the patient with a mask of choice, educate him on how to use the machine, how to put the mask on, etc. I have placed an order in the chart. Please send the order to a local DME, talk to patient, send report to referring MD. Please also reinforce the need for compliance with treatment. We will need a FU in sleep clinic for 10 weeks post-PAP set up, please arrange that with me or one of our NPs. Thanks,   [name], MD, PhD [facility])

## 2023-01-27 NOTE — Telephone Encounter (Signed)
I called pt and relayed the sleep study results.   Severe OSA. Recommend autopap.   Use 4 hours or more every night.  We see back in 2-3 months for compliance appt.  Appt 04-14-2023 with MM/NP mychart VV initial cpap.  DME [facility]/ Kathryne Sharper.  He is to call us if not heard from within a weeks time.  He had no questions at this time.  Will call back as needed.

## 2023-01-28 NOTE — Telephone Encounter (Signed)
New, Doristine Mango, RN; Kathyrn Sheriff Order recieved, thank you!     Previous Messages    ----- Message ----- From: Guy Begin, RN Sent: 01/28/2023   9:22 AM EST To: Kathyrn Sheriff Subject: FW: new autopap user  Severe OSA              I think I already responded to you yesterday ,  but the order looked like it stated this.  Electronics engineer

## 2023-03-31 ENCOUNTER — Encounter: Payer: Self-pay | Admitting: Medical-Surgical

## 2023-03-31 NOTE — Telephone Encounter (Signed)
Patient schld for 04/15/23 for re-evaluate. Will keep BP log once daily and bring with him to appt.

## 2023-03-31 NOTE — Telephone Encounter (Signed)
Yes, I would like him to come in for an appointment to reevaluate.  Thanks,  Ander Slade

## 2023-03-31 NOTE — Telephone Encounter (Signed)
Spoke with patient.  He states he has never started the valsartan 40mg   States there was problems with this medication and his insurance co.  He had thought the script had been cancelled by pharmacy but per pharmacy still has refills but  cost is $195 for patient.  Patient states he did not come back for the 2 week nurse visit BP check as he could not get the medication and did  not think it was needed unless he was on the medication.  I told him it has now been 6 months since last visit and he may need to be seen to proceed but I would forward message to Christen Butter, NP  to see what to do from here.

## 2023-04-13 DIAGNOSIS — G4733 Obstructive sleep apnea (adult) (pediatric): Secondary | ICD-10-CM

## 2023-04-14 ENCOUNTER — Telehealth: Payer: 59 | Admitting: Adult Health

## 2023-04-14 NOTE — Telephone Encounter (Signed)
 Spoke with pt and Shena spoke with [employer]. Patient states he did not receive a new machine because of two reasons: 1. He ended up finally receiving a replacement [employer]ips dreamstation-2 machine and 2. He was going to have to pay $500-600 out of pocket for a new machine. He just received the machine about a month ago but has had trouble using it due to not having the right attachments. I spoke with Dr Frances Furbish. We have rescheduled the pt for a visit on 05/12/23 at 815 am. He will bring his machine by on the 24th or 25th for a download. We have also sent an order to [employer] for them to get the patient supplies compatible with phillips. The patient thanked me for the call.

## 2023-04-15 ENCOUNTER — Ambulatory Visit: Payer: 59 | Admitting: Medical-Surgical

## 2023-04-15 NOTE — Telephone Encounter (Signed)
 New, Wesley Pacheco, Wesley Jefferson, RN; [place], Hipolito Bayley; 1 other Received, thank you!

## 2023-05-12 ENCOUNTER — Telehealth: Payer: 59 | Admitting: Adult Health

## 2023-07-13 ENCOUNTER — Telehealth: Payer: Self-pay | Admitting: Neurology

## 2023-07-13 NOTE — Telephone Encounter (Signed)
 Pt hasn't new machine never got a call back regarding supplies so has not been wearing the Cpap. Cancelled the appt for 5/28 please call to discuss with him and reschedule next appt.

## 2023-07-13 NOTE — Telephone Encounter (Signed)
 I called [employer] in [place] spoke to [place].  She relayed that looking at the note that they had tried to contact pt. Twice in April and 06-28-2023 could not LM.  (Not sure about march)  They were trying to get his new phillips machine serial #.  In store not many supplies for phillips machines, but resupply (mail) could possibly help.  I relayed to pt the above.  He will call [place] office at [phone].  Other option is [facility] [phone].  Pt will call back or email us  about call and plan.  I told him that wanted this to follow up on his supplies.  He verbalized understanding.

## 2023-07-14 ENCOUNTER — Ambulatory Visit: Admitting: Neurology

## 2023-07-19 NOTE — Telephone Encounter (Signed)
 I called pt and mailbox full, could not leave message.

## 2023-10-19 ENCOUNTER — Encounter: Payer: Self-pay | Admitting: Sports Medicine
